# Patient Record
Sex: Female | Born: 1990 | Race: White | Hispanic: No | Marital: Married | Smoking: Current every day smoker
Health system: Southern US, Community
[De-identification: ages and names within clinical notes are randomized; demographics above are authoritative.]

## PROBLEM LIST (undated history)

## (undated) DIAGNOSIS — F32A Depression, unspecified: Secondary | ICD-10-CM

## (undated) DIAGNOSIS — I10 Essential (primary) hypertension: Secondary | ICD-10-CM

## (undated) DIAGNOSIS — K900 Celiac disease: Secondary | ICD-10-CM

## (undated) DIAGNOSIS — F329 Major depressive disorder, single episode, unspecified: Secondary | ICD-10-CM

## (undated) DIAGNOSIS — E039 Hypothyroidism, unspecified: Secondary | ICD-10-CM

## (undated) DIAGNOSIS — R569 Unspecified convulsions: Secondary | ICD-10-CM

## (undated) DIAGNOSIS — R87619 Unspecified abnormal cytological findings in specimens from cervix uteri: Secondary | ICD-10-CM

## (undated) HISTORY — DX: Major depressive disorder, single episode, unspecified: F32.9

## (undated) HISTORY — PX: REPAIR EXTENSOR TENDON HAND: SUR1171

## (undated) HISTORY — DX: Unspecified abnormal cytological findings in specimens from cervix uteri: R87.619

## (undated) HISTORY — DX: Depression, unspecified: F32.A

## (undated) HISTORY — DX: Essential (primary) hypertension: I10

## (undated) HISTORY — PX: TONSILLECTOMY: SUR1361

---
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
## 2010-07-26 ENCOUNTER — Emergency Department: Payer: Self-pay | Admitting: Emergency Medicine

## 2013-04-16 ENCOUNTER — Emergency Department: Payer: Self-pay | Admitting: Emergency Medicine

## 2013-06-25 ENCOUNTER — Emergency Department: Payer: Self-pay | Admitting: Emergency Medicine

## 2013-11-12 ENCOUNTER — Emergency Department: Payer: Self-pay | Admitting: Emergency Medicine

## 2013-11-12 LAB — URINALYSIS, COMPLETE
BILIRUBIN, UR: NEGATIVE
Bacteria: NONE SEEN
Blood: NEGATIVE
GLUCOSE, UR: NEGATIVE mg/dL (ref 0–75)
Ketone: NEGATIVE
LEUKOCYTE ESTERASE: NEGATIVE
Nitrite: NEGATIVE
Ph: 7 (ref 4.5–8.0)
Protein: NEGATIVE
SPECIFIC GRAVITY: 1.003 (ref 1.003–1.030)
Squamous Epithelial: <1
WBC UR: <1 /HPF (ref 0–5)

## 2013-11-12 LAB — COMPREHENSIVE METABOLIC PANEL
AST: 32 U/L (ref 15–37)
Albumin: 3.6 g/dL (ref 3.4–5.0)
Alkaline Phosphatase: 100 U/L
Anion Gap: 5 — ABNORMAL LOW (ref 7–16)
BUN: 6 mg/dL — ABNORMAL LOW (ref 7–18)
Bilirubin,Total: 0.2 mg/dL (ref 0.2–1.0)
CREATININE: 0.95 mg/dL (ref 0.60–1.30)
Calcium, Total: 8.4 mg/dL — ABNORMAL LOW (ref 8.5–10.1)
Chloride: 107 mmol/L (ref 98–107)
Co2: 26 mmol/L (ref 21–32)
EGFR (African American): >60
EGFR (Non-African Amer.): >60
Glucose: 82 mg/dL (ref 65–99)
OSMOLALITY: 272 (ref 275–301)
Potassium: 3.6 mmol/L (ref 3.5–5.1)
SGPT (ALT): 34 U/L (ref 12–78)
SODIUM: 138 mmol/L (ref 136–145)
Total Protein: 7.5 g/dL (ref 6.4–8.2)

## 2013-11-12 LAB — CBC WITH DIFFERENTIAL/PLATELET
Basophil #: 0.1 10*3/uL (ref 0.0–0.1)
Basophil %: 0.5 %
EOS PCT: 1.9 %
Eosinophil #: 0.2 10*3/uL (ref 0.0–0.7)
HCT: 38.8 % (ref 35.0–47.0)
HGB: 12.9 g/dL (ref 12.0–16.0)
LYMPHS ABS: 4.3 10*3/uL — AB (ref 1.0–3.6)
LYMPHS PCT: 34.1 %
MCH: 27.9 pg (ref 26.0–34.0)
MCHC: 33.2 g/dL (ref 32.0–36.0)
MCV: 84 fL (ref 80–100)
MONO ABS: 0.8 x10 3/mm (ref 0.2–0.9)
MONOS PCT: 6.6 %
Neutrophil #: 7.2 10*3/uL — ABNORMAL HIGH (ref 1.4–6.5)
Neutrophil %: 56.9 %
Platelet: 295 10*3/uL (ref 150–440)
RBC: 4.62 10*6/uL (ref 3.80–5.20)
RDW: 14.6 % — ABNORMAL HIGH (ref 11.5–14.5)
WBC: 12.7 10*3/uL — ABNORMAL HIGH (ref 3.6–11.0)

## 2013-12-22 ENCOUNTER — Emergency Department: Payer: Self-pay

## 2014-04-16 ENCOUNTER — Emergency Department: Payer: Self-pay | Admitting: Emergency Medicine

## 2014-09-08 ENCOUNTER — Emergency Department: Payer: Self-pay | Admitting: Student

## 2014-09-20 ENCOUNTER — Emergency Department: Payer: Self-pay | Admitting: Emergency Medicine

## 2014-09-20 ENCOUNTER — Ambulatory Visit: Payer: Self-pay | Admitting: Surgery

## 2014-12-15 ENCOUNTER — Emergency Department
Admission: EM | Admit: 2014-12-15 | Discharge: 2014-12-16 | Disposition: A | Discharge status: Home / Self Care | Payer: 59 | Attending: Student | Admitting: Student

## 2014-12-15 DIAGNOSIS — R1031 Right lower quadrant pain: Secondary | ICD-10-CM | POA: Insufficient documentation

## 2014-12-15 DIAGNOSIS — Z3202 Encounter for pregnancy test, result negative: Secondary | ICD-10-CM | POA: Insufficient documentation

## 2014-12-15 DIAGNOSIS — R112 Nausea with vomiting, unspecified: Secondary | ICD-10-CM | POA: Insufficient documentation

## 2014-12-15 DIAGNOSIS — R52 Pain, unspecified: Secondary | ICD-10-CM

## 2014-12-15 LAB — CBC WITH DIFFERENTIAL/PLATELET
Basophils Absolute: 0 10*3/uL (ref 0–0.1)
Basophils Relative: 1 %
EOS PCT: 3 %
Eosinophils Absolute: 0.2 10*3/uL (ref 0–0.7)
HEMATOCRIT: 41.3 % (ref 35.0–47.0)
Hemoglobin: 13.6 g/dL (ref 12.0–16.0)
Lymphocytes Relative: 38 %
Lymphs Abs: 3.5 10*3/uL (ref 1.0–3.6)
MCH: 28.6 pg (ref 26.0–34.0)
MCHC: 32.9 g/dL (ref 32.0–36.0)
MCV: 87 fL (ref 80.0–100.0)
MONOS PCT: 5 %
Monocytes Absolute: 0.5 10*3/uL (ref 0.2–0.9)
NEUTROS ABS: 4.9 10*3/uL (ref 1.4–6.5)
Neutrophils Relative %: 53 %
Platelets: 285 10*3/uL (ref 150–440)
RBC: 4.75 MIL/uL (ref 3.80–5.20)
RDW: 14.6 % — AB (ref 11.5–14.5)
WBC: 9.2 10*3/uL (ref 3.6–11.0)

## 2014-12-15 LAB — URINALYSIS COMPLETE WITH MICROSCOPIC (ARMC ONLY)
Bacteria, UA: NONE SEEN
Bilirubin Urine: NEGATIVE
Glucose, UA: NEGATIVE mg/dL
Hgb urine dipstick: NEGATIVE
Ketones, ur: NEGATIVE mg/dL
Leukocytes, UA: NEGATIVE
Nitrite: NEGATIVE
Protein, ur: NEGATIVE mg/dL
RBC / HPF: NONE SEEN RBC/hpf (ref 0–5)
Specific Gravity, Urine: 1.002 — ABNORMAL LOW (ref 1.005–1.030)
WBC, UA: NONE SEEN WBC/hpf (ref 0–5)
pH: 6 (ref 5.0–8.0)

## 2014-12-15 LAB — COMPREHENSIVE METABOLIC PANEL WITH GFR
ALT: 13 U/L — ABNORMAL LOW (ref 14–54)
AST: 25 U/L (ref 15–41)
Albumin: 4 g/dL (ref 3.5–5.0)
Alkaline Phosphatase: 67 U/L (ref 38–126)
Anion gap: 9 (ref 5–15)
BUN: 7 mg/dL (ref 6–20)
CO2: 22 mmol/L (ref 22–32)
Calcium: 9.2 mg/dL (ref 8.9–10.3)
Chloride: 108 mmol/L (ref 101–111)
Creatinine, Ser: 0.75 mg/dL (ref 0.44–1.00)
GFR calc Af Amer: >60 mL/min
GFR calc non Af Amer: >60 mL/min
Glucose, Bld: 130 mg/dL — ABNORMAL HIGH (ref 65–99)
Potassium: 3.4 mmol/L — ABNORMAL LOW (ref 3.5–5.1)
Sodium: 139 mmol/L (ref 135–145)
Total Bilirubin: 0.4 mg/dL (ref 0.3–1.2)
Total Protein: 7.5 g/dL (ref 6.5–8.1)

## 2014-12-15 LAB — POCT PREGNANCY, URINE: Preg Test, Ur: NEGATIVE

## 2014-12-15 LAB — LIPASE, BLOOD: LIPASE: 26 U/L (ref 22–51)

## 2014-12-15 NOTE — ED Provider Notes (Signed)
[facility] Emergency Department Provider Note  ____________________________________________  Time seen: Approximately 11:52 PM  I have reviewed the triage vital signs and the nursing notes.   HISTORY  Chief Complaint Abdominal Pain    HPI Renee Norman is a 24 y.o. female with no chronic medical problems, currently menstruating, who presents for evaluation of 3-4 days of intermittent gradual onset right lower quadrant pain radiating to the right flank. She also had one episode of nonbloody nonbilious emesis today. She reports the pain comes and goes in waves. Today this pain was quite severe and as she was walking to the kitchen, she had lightheadedness, tunnel vision and fainted falling onto the kitchen floor. She denies any head injury or any pain complaints as a result of the fall. The chest pain or difficulty breathing. No modifying factors. No diarrhea, no dysuria, no abnormal vaginal discharge.   No past medical history on file.  There are no active problems to display for this patient.   No past surgical history on file.  No current outpatient prescriptions on file.  Allergies Review of patient's allergies indicates no known allergies.  No family history on file.  Social History History  Substance Use Topics  . Smoking status: Not on file  . Smokeless tobacco: Not on file  . Alcohol Use: Not on file    Review of Systems Constitutional: No fever/chills Eyes: No visual changes. ENT: No sore throat. Cardiovascular: Denies chest pain. Respiratory: Denies shortness of breath. Gastrointestinal: + abdominal pain.  + nausea, + vomiting.  No diarrhea.  No constipation. Genitourinary: Negative for dysuria. Musculoskeletal: Negative for back pain. Skin: Negative for rash. Neurological: Negative for headaches, focal weakness or numbness.  10-point ROS otherwise negative.  ____________________________________________   PHYSICAL  EXAM:  VITAL SIGNS: ED Triage Vitals  Enc Vitals Group     BP 12/15/14 2121 146/84 mmHg     Pulse Rate 12/15/14 2121 91     Resp 12/15/14 2121 18     Temp 12/15/14 2121 98.4 F (36.9 C)     Temp Source 12/15/14 2121 Oral     SpO2 12/15/14 2121 98 %     Weight 12/15/14 2121 230 lb (104.327 kg)     Height 12/15/14 2121 5' 3"  (1.6 m)     Head Cir --      Peak Flow --      Pain Score 12/15/14 2122 7     Pain Loc --      Pain Edu? --      Excl. in [place]? --     Constitutional: Alert and oriented. Well appearing and in no acute distress. Eyes: Conjunctivae are normal. PERRL. EOMI. Head: Atraumatic. Nose: No congestion/rhinnorhea. Mouth/Throat: Mucous membranes are moist.  Oropharynx non-erythematous. Neck: No stridor.   Cardiovascular: Normal rate, regular rhythm. Grossly normal heart sounds.  Good peripheral circulation. Respiratory: Normal respiratory effort.  No retractions. Lungs CTAB. Gastrointestinal: Soft and nontender. No distention. No abdominal bruits. No CVA tenderness. Pelvic:small amount of dark blood in the vaginal vault from closed os, no bimanual tenderness or cervical motion tenderness  Musculoskeletal: No lower extremity tenderness nor edema.  No joint effusions. Neurologic:  Normal speech and language. No gross focal neurologic deficits are appreciated. Speech is normal. No gait instability. Skin:  Skin is warm, dry and intact. No rash noted. Psychiatric: Mood and affect are normal. Speech and behavior are normal.  ____________________________________________   LABS (all labs ordered are listed, but only abnormal results  are displayed)  Labs Reviewed  CBC WITH DIFFERENTIAL/PLATELET - Abnormal; Notable for the following:    RDW 14.6 (*)    All other components within normal limits  COMPREHENSIVE METABOLIC PANEL - Abnormal; Notable for the following:    Potassium 3.4 (*)    Glucose, Bld 130 (*)    ALT 13 (*)    All other components within normal limits   URINALYSIS COMPLETEWITH MICROSCOPIC ([facility] ONLY) - Abnormal; Notable for the following:    Color, Urine COLORLESS (*)    APPearance CLEAR (*)    Specific Gravity, Urine 1.002 (*)    Squamous Epithelial / LPF 0-5 (*)    All other components within normal limits  WET PREP, GENITAL  CHLAMYDIA/NGC RT PCR ([facility] ONLY)  LIPASE, BLOOD  TROPONIN I  POC URINE PREG, ED  POCT PREGNANCY, URINE   ____________________________________________  EKG  ED ECG REPORT I, Joanne Gavel, the attending physician, personally viewed and interpreted this ECG.   Date: 12/16/2014  EKG Time: 03:17  Rate: 57  Rhythm: sinus bradycardia  Axis: normal  Intervals:none  ST&T Change: No acute ST segment change  ____________________________________________  RADIOLOGY  CT abdomen and pelvis  FINDINGS: The appendix is normal. No acute inflammatory changes are evident in the abdomen or pelvis.  There are normal appearances of the liver, gallbladder, bile ducts, pancreas, spleen, adrenals and kidneys.  Stomach, small bowel and colon appear normal.  There is no abnormality in the lower chest.  There is a tiny fat containing umbilical hernia.  IMPRESSION: No significant abnormality.  Pelvic ultrasound FINDINGS: Uterus  Measurements: 8.1 x 3.6 x 5.8 cm. No fibroids or other mass visualized.  Endometrium  Thickness: 0.3 cm, within normal limits in caliber. No focal abnormality visualized.  Right ovary  Measurements: 4.4 x 2.6 x 2.6 cm. Normal appearance/no adnexal mass.  Left ovary  Measurements: 3.1 x 2.4 x 2.5 cm. Normal appearance/no adnexal mass.  Pulsed Doppler evaluation of both ovaries demonstrates normal low-resistance arterial and venous waveforms.  Other findings  No free fluid is seen in the pelvic cul-de-sac.  IMPRESSION: Unremarkable pelvic ultrasound. No evidence for ovarian  torsion.   ____________________________________________   PROCEDURES  Procedure(s) performed: None  Critical Care performed: No  ____________________________________________   INITIAL IMPRESSION / ASSESSMENT AND PLAN / ED COURSE  Pertinent labs & imaging results that were available during my care of the patient were reviewed by me and considered in my medical decision making (see chart for details).  Renee Norman is a 24 y.o. female with no chronic medical problems, currently menstruating, who presents for evaluation of 3-4 days of intermittent gradual onset right lower quadrant pain radiating to the right flank. On exam, she is generally well-appearing and in no acute distress. Vital signs stable, she is afebrile. Differential diagnosis includes acute ovarian pathology such as torsion, less likely tubo-ovarian abscess, appendicitis, kidney stone. I suspect she had a vasovagal syncope event secondary to pain. Intact neurological exam, no chest pain or difficulty breathing. Doubt purely cardiogenic or neurogenic syncope in this young healthy patient. We'll treat her pain, obtain ultrasound, basic labs, urinalysis, urine pregnancy.   ----------------------------------------- 6:19 AM on 12/16/2014 -----------------------------------------  EKG reassuring. CT and ultrasound negative for any acute intra-abdominal pathology. Wet prep positive for Trichomonas and the patient was treated empirically for all sexually transmitted infections with IM ceftriaxone, by mouth azithromycin, by mouth Flagyl. Pain is well controlled. We'll discharge with doxycycline for treatment of possible pelvic inflammatory disease. Discussed  return precautions and close GYN follow-up. She is comfortable with the discharge plan.   ____________________________________________   FINAL CLINICAL IMPRESSION(S) / ED DIAGNOSES  Final diagnoses:  Pain  RLQ abdominal pain      Joanne Gavel, MD 12/16/14  [phone]

## 2014-12-15 NOTE — ED Notes (Signed)
Patient reports right lower abd pain with nausea and vomiting since yesterday.  Patient reports fever at home and states she had an episode where she passed out.

## 2014-12-16 ENCOUNTER — Emergency Department: Payer: 59

## 2014-12-16 LAB — TROPONIN I: Troponin I: <0.03 ng/mL (ref <0.031)

## 2014-12-16 LAB — WET PREP, GENITAL
Clue Cells Wet Prep HPF POC: NONE SEEN
YEAST WET PREP: NONE SEEN

## 2014-12-16 LAB — CHLAMYDIA/NGC RT PCR (ARMC ONLY)
Chlamydia Tr: NOT DETECTED
N GONORRHOEAE: NOT DETECTED

## 2014-12-16 MED ORDER — ONDANSETRON HCL 4 MG/2ML IJ SOLN
4.0000 mg | Freq: Once | INTRAMUSCULAR | Status: AC
  Administered 2014-12-16: 4 mg via INTRAVENOUS

## 2014-12-16 MED ORDER — SODIUM CHLORIDE 0.9 % IV BOLUS (SEPSIS)
1000.0000 mL | Freq: Once | INTRAVENOUS | Status: AC
  Administered 2014-12-16: 1000 mL via INTRAVENOUS

## 2014-12-16 MED ORDER — CEFTRIAXONE SODIUM 250 MG IJ SOLR
250.0000 mg | Freq: Once | INTRAMUSCULAR | Status: AC
  Administered 2014-12-16: 250 mg via INTRAMUSCULAR

## 2014-12-16 MED ORDER — DOXYCYCLINE HYCLATE 50 MG PO CAPS: 100.0000 mg | ORAL_CAPSULE | Freq: Two times a day (BID) | ORAL | Status: DC

## 2014-12-16 MED ORDER — MORPHINE SULFATE 4 MG/ML IJ SOLN: 4.0000 mg | Freq: Once | INTRAMUSCULAR | Status: DC

## 2014-12-16 MED ORDER — AZITHROMYCIN 1 G PO PACK: 1.0000 g | PACK | Freq: Once | ORAL | Status: DC

## 2014-12-16 MED ORDER — ONDANSETRON HCL 4 MG/2ML IJ SOLN
INTRAMUSCULAR | Status: AC
  Administered 2014-12-16: 4 mg via INTRAVENOUS
  Filled 2014-12-16: qty 2

## 2014-12-16 MED ORDER — IOHEXOL 300 MG/ML  SOLN
100.0000 mL | Freq: Once | INTRAMUSCULAR | Status: AC | PRN
  Administered 2014-12-16: 100 mL via INTRAVENOUS

## 2014-12-16 MED ORDER — IBUPROFEN 600 MG PO TABS: 600.0000 mg | ORAL_TABLET | Freq: Four times a day (QID) | ORAL | Status: DC | PRN

## 2014-12-16 MED ORDER — AZITHROMYCIN 250 MG PO TABS
1000.0000 mg | ORAL_TABLET | Freq: Once | ORAL | Status: AC
  Administered 2014-12-16: 1000 mg via ORAL

## 2014-12-16 MED ORDER — METRONIDAZOLE 500 MG PO TABS
2000.0000 mg | ORAL_TABLET | Freq: Once | ORAL | Status: AC
  Administered 2014-12-16: 2000 mg via ORAL
  Filled 2014-12-16: qty 4

## 2014-12-16 MED ORDER — AZITHROMYCIN 250 MG PO TABS
ORAL_TABLET | ORAL | Status: AC
  Filled 2014-12-16: qty 4

## 2014-12-16 MED ORDER — IOHEXOL 240 MG/ML SOLN
25.0000 mL | Freq: Once | INTRAMUSCULAR | Status: AC | PRN
  Administered 2014-12-16: 25 mL via ORAL

## 2014-12-16 MED ORDER — MORPHINE SULFATE 10 MG/ML IJ SOLN
INTRAMUSCULAR | Status: AC
  Administered 2014-12-16: 4 mg
  Filled 2014-12-16: qty 1

## 2014-12-16 MED ORDER — CEFTRIAXONE SODIUM 250 MG IJ SOLR
INTRAMUSCULAR | Status: AC
  Filled 2014-12-16: qty 250

## 2014-12-16 NOTE — ED Notes (Signed)
Pt back to room.

## 2014-12-16 NOTE — ED Notes (Signed)
Patient transported to CT 

## 2014-12-16 NOTE — ED Notes (Signed)
Pt hyperventilating and screaming after rocephin injection. Ice applied to injection site. Pt calms with reassurance. Family remains at bedside.

## 2014-12-16 NOTE — ED Notes (Signed)
Pt and family sleeping in room.

## 2014-12-16 NOTE — ED Notes (Signed)
Patient transported to Ultrasound 

## 2015-09-25 ENCOUNTER — Emergency Department
Admission: EM | Admit: 2015-09-25 | Discharge: 2015-09-25 | Disposition: A | Discharge status: Home / Self Care | Payer: 59 | Attending: Student | Admitting: Student

## 2015-09-25 DIAGNOSIS — F17210 Nicotine dependence, cigarettes, uncomplicated: Secondary | ICD-10-CM | POA: Diagnosis not present

## 2015-09-25 DIAGNOSIS — J0110 Acute frontal sinusitis, unspecified: Secondary | ICD-10-CM | POA: Diagnosis not present

## 2015-09-25 DIAGNOSIS — J209 Acute bronchitis, unspecified: Secondary | ICD-10-CM | POA: Insufficient documentation

## 2015-09-25 DIAGNOSIS — R05 Cough: Secondary | ICD-10-CM | POA: Diagnosis present

## 2015-09-25 MED ORDER — ALBUTEROL SULFATE HFA 108 (90 BASE) MCG/ACT IN AERS: 2.0000 | INHALATION_SPRAY | RESPIRATORY_TRACT | Status: DC | PRN

## 2015-09-25 MED ORDER — AMOXICILLIN 500 MG PO TABS: 500.0000 mg | ORAL_TABLET | Freq: Three times a day (TID) | ORAL | Status: DC

## 2015-09-25 MED ORDER — GUAIFENESIN-CODEINE 100-10 MG/5ML PO SYRP: 5.0000 mL | ORAL_SOLUTION | Freq: Three times a day (TID) | ORAL | Status: DC | PRN

## 2015-09-25 MED ORDER — PREDNISONE 10 MG PO TABS: 50.0000 mg | ORAL_TABLET | Freq: Every day | ORAL | Status: DC

## 2015-09-25 NOTE — ED Provider Notes (Signed)
[facility] Emergency Department Provider Note  ____________________________________________  Time seen: Approximately 3:13 PM  I have reviewed the triage vital signs and the nursing notes.   HISTORY  Chief Complaint Influenza   HPI Renee Norman is a 25 y.o. female who presents to the emergency department for evaluation of cough, nasal congestion, sore throat, and wheezing x 1 week. Fever started yesterday. Only minor improvement with cough, cold, and flu medications.    History reviewed. No pertinent past medical history.  There are no active problems to display for this patient.   Past Surgical History  Procedure Laterality Date  . Tonsillectomy      Current Outpatient Rx  Name  Route  Sig  Dispense  Refill  . albuterol (PROVENTIL HFA;VENTOLIN HFA) 108 (90 Base) MCG/ACT inhaler   Inhalation   Inhale 2 puffs into the lungs every 4 (four) hours as needed for wheezing or shortness of breath.   1 Inhaler   1   . amoxicillin (AMOXIL) 500 MG tablet   Oral   Take 1 tablet (500 mg total) by mouth 3 (three) times daily.   30 tablet   0   . doxycycline (VIBRAMYCIN) 50 MG capsule   Oral   Take 2 capsules (100 mg total) by mouth 2 (two) times daily.   56 capsule   0   . guaiFENesin-codeine (ROBITUSSIN AC) 100-10 MG/5ML syrup   Oral   Take 5 mLs by mouth 3 (three) times daily as needed for cough.   120 mL   0   . ibuprofen (ADVIL,MOTRIN) 600 MG tablet   Oral   Take 1 tablet (600 mg total) by mouth every 6 (six) hours as needed for moderate pain.   15 tablet   0   . predniSONE (DELTASONE) 10 MG tablet   Oral   Take 5 tablets (50 mg total) by mouth daily.   25 tablet   0     Allergies Review of patient's allergies indicates no known allergies.  No family history on file.  Social History Social History  Substance Use Topics  . Smoking status: Current Every Day Smoker -- 0.50 packs/day    Types: Cigarettes  . Smokeless  tobacco: None  . Alcohol Use: Yes     Comment: occassionally    Review of Systems Constitutional: Positive for fever/chills ENT: Positive for  sore throat and sinus congestion. Respiratory: No shortness of breath. Positive for cough. Gastrointestinal: No abdominal pain. Occasional nausea,  no vomiting.  No diarrhea.  Genitourinary: Negative for dysuria. Musculoskeletal: Negative for body aches Skin: Negative for rash. Neurological: Positive for headaches, Negative for focal weakness or numbness.  ____________________________________________   PHYSICAL EXAM:  VITAL SIGNS: ED Triage Vitals  Enc Vitals Group     BP 09/25/15 1423 156/84 mmHg     Pulse Rate 09/25/15 1423 72     Resp 09/25/15 1423 17     Temp 09/25/15 1423 97.9 F (36.6 C)     Temp Source 09/25/15 1423 Oral     SpO2 09/25/15 1423 99 %     Weight 09/25/15 1423 220 lb (99.791 kg)     Height 09/25/15 1423 5' 4"  (1.626 m)     Head Cir --      Peak Flow --      Pain Score 09/25/15 1424 6     Pain Loc --      Pain Edu? --      Excl. in [place]? --  Constitutional: Alert and oriented. Well appearing and in no acute distress. Eyes: Conjunctivae are normal. PERRL. EOMI. Ears: TM normal bilaterally Nose: Frontal sinus tenderness on percussion Mouth/Throat: Mucous membranes are moist.  Oropharynx erythematous without tonsillar exudate or edema. Neck: No stridor.  Lymphatic: Bilateral aterior cervical lymphadenopathy. Cardiovascular: Normal rate, regular rhythm. Grossly normal heart sounds.  Good peripheral circulation. Respiratory: Normal respiratory effort.  No retractions. Breath sounds clear bilaterally. Gastrointestinal: Soft and nontender. No distention. No abdominal bruits. N Musculoskeletal: Active ROM x 4 extremities observed. Neurologic:  Normal speech and language. No gross focal neurologic deficits are appreciated. Speech is normal. No gait instability. Skin:  Skin is warm, dry and intact. No rash  noted. Psychiatric: Mood and affect are normal. Speech and behavior are normal.  ____________________________________________   LABS (all labs ordered are listed, but only abnormal results are displayed)  Labs Reviewed - No data to display ____________________________________________  EKG   ____________________________________________  RADIOLOGY  Not indicated ____________________________________________   PROCEDURES  Procedure(s) performed: None  Critical Care performed: No  ____________________________________________   INITIAL IMPRESSION / ASSESSMENT AND PLAN / ED COURSE  Pertinent labs & imaging results that were available during my care of the patient were reviewed by me and considered in my medical decision making (see chart for details).  Patient to take amoxicillin and prednisone as prescribed. She will use Albuterol prn and Robitussin AC prn as directed. She is to follow up with her PCP for symptoms that are not improving over the next few days or return to the emergency department for symptoms that change or worsen if unable to schedule an appointment. ____________________________________________   FINAL CLINICAL IMPRESSION(S) / ED DIAGNOSES  Final diagnoses:  Acute frontal sinusitis, recurrence not specified  Acute bronchitis, unspecified organism       Victorino Dike, Luis M. Cintron 09/25/15 1600  Joanne Gavel, MD 09/26/15 [phone]

## 2015-09-25 NOTE — ED Notes (Signed)
States she developed some congestion/cough about 1 week ago.. Then fever since yesterday

## 2015-09-25 NOTE — ED Notes (Signed)
Pt c/o cough with sinus and chest congestion, bodyaches and fever for the past couple of days.. States she has been taking theraflu, tylenol and cold/cough meds without relief.

## 2015-09-25 NOTE — Discharge Instructions (Signed)
Sinusitis, Adult Sinusitis is redness, soreness, and inflammation of the paranasal sinuses. Paranasal sinuses are air pockets within the bones of your face. They are located beneath your eyes, in the middle of your forehead, and above your eyes. In healthy paranasal sinuses, mucus is able to drain out, and air is able to circulate through them by way of your nose. However, when your paranasal sinuses are inflamed, mucus and air can become trapped. This can allow bacteria and other germs to grow and cause infection. Sinusitis can develop quickly and last only a short time (acute) or continue over a long period (chronic). Sinusitis that lasts for more than 12 weeks is considered chronic. CAUSES Causes of sinusitis include:  Allergies.  Structural abnormalities, such as displacement of the cartilage that separates your nostrils (deviated septum), which can decrease the air flow through your nose and sinuses and affect sinus drainage.  Functional abnormalities, such as when the small hairs (cilia) that line your sinuses and help remove mucus do not work properly or are not present. SIGNS AND SYMPTOMS Symptoms of acute and chronic sinusitis are the same. The primary symptoms are pain and pressure around the affected sinuses. Other symptoms include:  Upper toothache.  Earache.  Headache.  Bad breath.  Decreased sense of smell and taste.  A cough, which worsens when you are lying flat.  Fatigue.  Fever.  Thick drainage from your nose, which often is green and may contain pus (purulent).  Swelling and warmth over the affected sinuses. DIAGNOSIS Your health care provider will perform a physical exam. During your exam, your health care provider may perform any of the following to help determine if you have acute sinusitis or chronic sinusitis:  Look in your nose for signs of abnormal growths in your nostrils (nasal polyps).  Tap over the affected sinus to check for signs of  infection.  View the inside of your sinuses using an imaging device that has a light attached (endoscope). If your health care provider suspects that you have chronic sinusitis, one or more of the following tests may be recommended:  Allergy tests.  Nasal culture. A sample of mucus is taken from your nose, sent to a lab, and screened for bacteria.  Nasal cytology. A sample of mucus is taken from your nose and examined by your health care provider to determine if your sinusitis is related to an allergy. TREATMENT Most cases of acute sinusitis are related to a viral infection and will resolve on their own within 10 days. Sometimes, medicines are prescribed to help relieve symptoms of both acute and chronic sinusitis. These may include pain medicines, decongestants, nasal steroid sprays, or saline sprays. However, for sinusitis related to a bacterial infection, your health care provider will prescribe antibiotic medicines. These are medicines that will help kill the bacteria causing the infection. Rarely, sinusitis is caused by a fungal infection. In these cases, your health care provider will prescribe antifungal medicine. For some cases of chronic sinusitis, surgery is needed. Generally, these are cases in which sinusitis recurs more than 3 times per year, despite other treatments. HOME CARE INSTRUCTIONS  Drink plenty of water. Water helps thin the mucus so your sinuses can drain more easily.  Use a humidifier.  Inhale steam 3-4 times a day (for example, sit in the bathroom with the shower running).  Apply a warm, moist washcloth to your face 3-4 times a day, or as directed by your health care provider.  Use saline nasal sprays to help  moisten and clean your sinuses.  Take medicines only as directed by your health care provider.  If you were prescribed either an antibiotic or antifungal medicine, finish it all even if you start to feel better. SEEK IMMEDIATE MEDICAL CARE IF:  You have  increasing pain or severe headaches.  You have nausea, vomiting, or drowsiness.  You have swelling around your face.  You have vision problems.  You have a stiff neck.  You have difficulty breathing.   This information is not intended to replace advice given to you by your health care provider. Make sure you discuss any questions you have with your health care provider.   Document Released: 06/23/2005 Document Revised: 07/14/2014 Document Reviewed: 07/08/2011 Elsevier Interactive Patient Education 2016 [employer]  Acute Bronchitis Bronchitis is inflammation of the airways that extend from the windpipe into the lungs (bronchi). The inflammation often causes mucus to develop. This leads to a cough, which is the most common symptom of bronchitis.  In acute bronchitis, the condition usually develops suddenly and goes away over time, usually in a couple weeks. Smoking, allergies, and asthma can make bronchitis worse. Repeated episodes of bronchitis may cause further lung problems.  CAUSES Acute bronchitis is most often caused by the same virus that causes a cold. The virus can spread from person to person (contagious) through coughing, sneezing, and touching contaminated objects. SIGNS AND SYMPTOMS   Cough.   Fever.   Coughing up mucus.   Body aches.   Chest congestion.   Chills.   Shortness of breath.   Sore throat.  DIAGNOSIS  Acute bronchitis is usually diagnosed through a physical exam. Your health care provider will also ask you questions about your medical history. Tests, such as chest X-rays, are sometimes done to rule out other conditions.  TREATMENT  Acute bronchitis usually goes away in a couple weeks. Oftentimes, no medical treatment is necessary. Medicines are sometimes given for relief of fever or cough. Antibiotic medicines are usually not needed but may be prescribed in certain situations. In some cases, an inhaler may be recommended to help reduce  shortness of breath and control the cough. A cool mist vaporizer may also be used to help thin bronchial secretions and make it easier to clear the chest.  HOME CARE INSTRUCTIONS  Get plenty of rest.   Drink enough fluids to keep your urine clear or pale yellow (unless you have a medical condition that requires fluid restriction). Increasing fluids may help thin your respiratory secretions (sputum) and reduce chest congestion, and it will prevent dehydration.   Take medicines only as directed by your health care provider.  If you were prescribed an antibiotic medicine, finish it all even if you start to feel better.  Avoid smoking and secondhand smoke. Exposure to cigarette smoke or irritating chemicals will make bronchitis worse. If you are a smoker, consider using nicotine gum or skin patches to help control withdrawal symptoms. Quitting smoking will help your lungs heal faster.   Reduce the chances of another bout of acute bronchitis by washing your hands frequently, avoiding people with cold symptoms, and trying not to touch your hands to your mouth, nose, or eyes.   Keep all follow-up visits as directed by your health care provider.  SEEK MEDICAL CARE IF: Your symptoms do not improve after 1 week of treatment.  SEEK IMMEDIATE MEDICAL CARE IF:  You develop an increased fever or chills.   You have chest pain.   You have severe shortness of  breath.  You have bloody sputum.   You develop dehydration.  You faint or repeatedly feel like you are going to pass out.  You develop repeated vomiting.  You develop a severe headache. MAKE SURE YOU:   Understand these instructions.  Will watch your condition.  Will get help right away if you are not doing well or get worse.   This information is not intended to replace advice given to you by your health care provider. Make sure you discuss any questions you have with your health care provider.   Document Released:  07/31/2004 Document Revised: 07/14/2014 Document Reviewed: 12/14/2012 Elsevier Interactive Patient Education [employer].

## 2015-10-04 ENCOUNTER — Emergency Department
Admission: EM | Admit: 2015-10-04 | Discharge: 2015-10-04 | Disposition: A | Discharge status: Home / Self Care | Payer: 59 | Attending: Emergency Medicine | Admitting: Emergency Medicine

## 2015-10-04 ENCOUNTER — Encounter: Payer: Self-pay | Admitting: Emergency Medicine

## 2015-10-04 DIAGNOSIS — J309 Allergic rhinitis, unspecified: Secondary | ICD-10-CM | POA: Insufficient documentation

## 2015-10-04 DIAGNOSIS — R05 Cough: Secondary | ICD-10-CM | POA: Diagnosis present

## 2015-10-04 DIAGNOSIS — Z79899 Other long term (current) drug therapy: Secondary | ICD-10-CM | POA: Diagnosis not present

## 2015-10-04 DIAGNOSIS — Z7952 Long term (current) use of systemic steroids: Secondary | ICD-10-CM | POA: Insufficient documentation

## 2015-10-04 DIAGNOSIS — F17210 Nicotine dependence, cigarettes, uncomplicated: Secondary | ICD-10-CM | POA: Diagnosis not present

## 2015-10-04 DIAGNOSIS — Z792 Long term (current) use of antibiotics: Secondary | ICD-10-CM | POA: Insufficient documentation

## 2015-10-04 MED ORDER — FEXOFENADINE-PSEUDOEPHED ER 60-120 MG PO TB12: 1.0000 | ORAL_TABLET | Freq: Two times a day (BID) | ORAL | Status: DC

## 2015-10-04 MED ORDER — BENZONATATE 100 MG PO CAPS: 100.0000 mg | ORAL_CAPSULE | Freq: Three times a day (TID) | ORAL | Status: DC | PRN

## 2015-10-04 NOTE — ED Notes (Signed)
See triage.  States she was seen couple of weeks ago. And given prednisone and amoxil.  States she finished her meds and feels worse

## 2015-10-04 NOTE — ED Provider Notes (Signed)
[facility] Emergency Department Provider Note  ____________________________________________  Time seen: Approximately 1:31 PM  I have reviewed the triage vital signs and the nursing notes.   HISTORY  Chief Complaint sinus congestion  and Cough    HPI Renee Norman is a 25 y.o. female patient complaining of continued sinus congestion for couple weeks. Patient was seen on 09/26/2015 at this ED diagnosis sinusitis given amoxicillin and prednisone. Patient stated this been no change in her condition.She rates the pain as 8/10. Patient state postnasal drainage causing her cough. No palliative measures taken at this time. Patient did not follow-up with family doctor as directed from her last ER visit.  History reviewed. No pertinent past medical history.  There are no active problems to display for this patient.   Past Surgical History  Procedure Laterality Date  . Tonsillectomy      Current Outpatient Rx  Name  Route  Sig  Dispense  Refill  . albuterol (PROVENTIL HFA;VENTOLIN HFA) 108 (90 Base) MCG/ACT inhaler   Inhalation   Inhale 2 puffs into the lungs every 4 (four) hours as needed for wheezing or shortness of breath.   1 Inhaler   1   . amoxicillin (AMOXIL) 500 MG tablet   Oral   Take 1 tablet (500 mg total) by mouth 3 (three) times daily.   30 tablet   0   . benzonatate (TESSALON PERLES) 100 MG capsule   Oral   Take 1 capsule (100 mg total) by mouth 3 (three) times daily as needed for cough.   15 capsule   0   . doxycycline (VIBRAMYCIN) 50 MG capsule   Oral   Take 2 capsules (100 mg total) by mouth 2 (two) times daily.   56 capsule   0   . fexofenadine-pseudoephedrine (ALLEGRA-D) 60-120 MG 12 hr tablet   Oral   Take 1 tablet by mouth 2 (two) times daily.   20 tablet   0   . guaiFENesin-codeine (ROBITUSSIN AC) 100-10 MG/5ML syrup   Oral   Take 5 mLs by mouth 3 (three) times daily as needed for cough.   120 mL   0   .  ibuprofen (ADVIL,MOTRIN) 600 MG tablet   Oral   Take 1 tablet (600 mg total) by mouth every 6 (six) hours as needed for moderate pain.   15 tablet   0   . predniSONE (DELTASONE) 10 MG tablet   Oral   Take 5 tablets (50 mg total) by mouth daily.   25 tablet   0     Allergies Review of patient's allergies indicates no known allergies.  No family history on file.  Social History Social History  Substance Use Topics  . Smoking status: Current Every Day Smoker -- 0.50 packs/day    Types: Cigarettes  . Smokeless tobacco: None  . Alcohol Use: Yes     Comment: occassionally    Review of Systems Constitutional: No fever/chills Eyes: No visual changes. ENT: No sore throat. Bilateral nasal congestion and facial pain. Cardiovascular: Denies chest pain. Respiratory: Denies shortness of breath. Gastrointestinal: No abdominal pain.  No nausea, no vomiting.  No diarrhea.  No constipation. Genitourinary: Negative for dysuria. Musculoskeletal: Negative for back pain. Skin: Negative for rash. Neurological: Negative for headaches, focal weakness or numbness.   ____________________________________________   PHYSICAL EXAM:  VITAL SIGNS: ED Triage Vitals  Enc Vitals Group     BP 10/04/15 1254 163/98 mmHg     Pulse Rate 10/04/15  1254 70     Resp 10/04/15 1254 18     Temp 10/04/15 1254 98.1 F (36.7 C)     Temp Source 10/04/15 1254 Oral     SpO2 10/04/15 1254 99 %     Weight 10/04/15 1254 225 lb (102.059 kg)     Height 10/04/15 1254 5' 4"  (1.626 m)     Head Cir --      Peak Flow --      Pain Score 10/04/15 1254 8     Pain Loc --      Pain Edu? --      Excl. in [place]? --     Constitutional: Alert and oriented. Well appearing and in no acute distress. Eyes: Conjunctivae are normal. PERRL. EOMI. Head: Atraumatic. Nose: Bilateral maxillary guarding, edematous nasal turbinates, clear rhinorrhea. Mouth/Throat: Mucous membranes are moist.  Oropharynx non-erythematous. His  postnasal drainage. Neck: No stridor.  No cervical spine tenderness to palpation. Hematological/Lymphatic/Immunilogical: No cervical lymphadenopathy. Cardiovascular: Normal rate, regular rhythm. Grossly normal heart sounds.  Good peripheral circulation. We'll get a blood pressure Respiratory: Normal respiratory effort.  No retractions. Lungs CTAB. Gastrointestinal: Soft and nontender. No distention. No abdominal bruits. No CVA tenderness. Musculoskeletal: No lower extremity tenderness nor edema.  No joint effusions. Neurologic:  Normal speech and language. No gross focal neurologic deficits are appreciated. No gait instability. Skin:  Skin is warm, dry and intact. No rash noted. Psychiatric: Mood and affect are normal. Speech and behavior are normal.  ____________________________________________   LABS (all labs ordered are listed, but only abnormal results are displayed)  Labs Reviewed - No data to display ____________________________________________  EKG   ____________________________________________  RADIOLOGY   ____________________________________________   PROCEDURES  Procedure(s) performed: None  Critical Care performed: No  ____________________________________________   INITIAL IMPRESSION / ASSESSMENT AND PLAN / ED COURSE  Pertinent labs & imaging results that were available during my care of the patient were reviewed by me and considered in my medical decision making (see chart for details).  Allergic rhinitis.  Discharge care instructions. Patient given a prescription for Allegra-D. Patient advised to follow-up family doctor consider consult to an allergist for definitive diagnosis and treatment. ____________________________________________   FINAL CLINICAL IMPRESSION(S) / ED DIAGNOSES  Final diagnoses:  Allergic rhinitis, unspecified allergic rhinitis type      Sable Feil, PA-C 10/04/15 Ramos, PA-C 10/04/15 1344  Lavonia Drafts, MD 10/04/15 [phone]

## 2015-10-04 NOTE — Discharge Instructions (Signed)
Allergic Rhinitis Allergic rhinitis is when the mucous membranes in the nose respond to allergens. Allergens are particles in the air that cause your body to have an allergic reaction. This causes you to release allergic antibodies. Through a chain of events, these eventually cause you to release histamine into the blood stream. Although meant to protect the body, it is this release of histamine that causes your discomfort, such as frequent sneezing, congestion, and an itchy, runny nose.  CAUSES Seasonal allergic rhinitis (hay fever) is caused by pollen allergens that may come from grasses, trees, and weeds. Year-round allergic rhinitis (perennial allergic rhinitis) is caused by allergens such as house dust mites, pet dander, and mold spores. SYMPTOMS  Nasal stuffiness (congestion).  Itchy, runny nose with sneezing and tearing of the eyes. DIAGNOSIS Your health care provider can help you determine the allergen or allergens that trigger your symptoms. If you and your health care provider are unable to determine the allergen, skin or blood testing may be used. Your health care provider will diagnose your condition after taking your health history and performing a physical exam. Your health care provider may assess you for other related conditions, such as asthma, pink eye, or an ear infection. TREATMENT Allergic rhinitis does not have a cure, but it can be controlled by:  Medicines that block allergy symptoms. These may include allergy shots, nasal sprays, and oral antihistamines.  Avoiding the allergen. Hay fever may often be treated with antihistamines in pill or nasal spray forms. Antihistamines block the effects of histamine. There are over-the-counter medicines that may help with nasal congestion and swelling around the eyes. Check with your health care provider before taking or giving this medicine. If avoiding the allergen or the medicine prescribed do not work, there are many new medicines  your health care provider can prescribe. Stronger medicine may be used if initial measures are ineffective. Desensitizing injections can be used if medicine and avoidance does not work. Desensitization is when a patient is given ongoing shots until the body becomes less sensitive to the allergen. Make sure you follow up with your health care provider if problems continue. HOME CARE INSTRUCTIONS It is not possible to completely avoid allergens, but you can reduce your symptoms by taking steps to limit your exposure to them. It helps to know exactly what you are allergic to so that you can avoid your specific triggers. SEEK MEDICAL CARE IF:  You have a fever.  You develop a cough that does not stop easily (persistent).  You have shortness of breath.  You start wheezing.  Symptoms interfere with normal daily activities.   This information is not intended to replace advice given to you by your health care provider. Make sure you discuss any questions you have with your health care provider.   Document Released: 03/18/2001 Document Revised: 07/14/2014 Document Reviewed: 02/28/2013 Elsevier Interactive Patient Education 2016 [employer]  

## 2015-10-04 NOTE — ED Notes (Signed)
C/O sinus congestion, ear pain, upper back sore, headaches x 3 weeks.  Seen in ED 10 days ago for same, treated with prednisone and amoxicillin - symptoms have not improved.

## 2017-01-13 ENCOUNTER — Emergency Department
Admission: EM | Admit: 2017-01-13 | Discharge: 2017-01-13 | Disposition: A | Discharge status: Home / Self Care | Payer: Self-pay | Attending: Emergency Medicine | Admitting: Emergency Medicine

## 2017-01-13 ENCOUNTER — Emergency Department: Payer: Self-pay

## 2017-01-13 ENCOUNTER — Encounter: Payer: Self-pay | Admitting: *Deleted

## 2017-01-13 DIAGNOSIS — F17210 Nicotine dependence, cigarettes, uncomplicated: Secondary | ICD-10-CM | POA: Insufficient documentation

## 2017-01-13 DIAGNOSIS — B349 Viral infection, unspecified: Secondary | ICD-10-CM | POA: Insufficient documentation

## 2017-01-13 DIAGNOSIS — J209 Acute bronchitis, unspecified: Secondary | ICD-10-CM | POA: Insufficient documentation

## 2017-01-13 MED ORDER — AZITHROMYCIN 500 MG PO TABS
500.0000 mg | ORAL_TABLET | Freq: Once | ORAL | Status: AC
  Administered 2017-01-13: 500 mg via ORAL
  Filled 2017-01-13: qty 1

## 2017-01-13 MED ORDER — AZITHROMYCIN 250 MG PO TABS: ORAL_TABLET | ORAL | 0 refills | Status: AC

## 2017-01-13 MED ORDER — ALBUTEROL SULFATE HFA 108 (90 BASE) MCG/ACT IN AERS: 2.0000 | INHALATION_SPRAY | Freq: Four times a day (QID) | RESPIRATORY_TRACT | 0 refills | Status: DC | PRN

## 2017-01-13 NOTE — ED Provider Notes (Signed)
[facility] Emergency Department Provider Note  ____________________________________________   First MD Initiated Contact with Patient 01/13/17 [phone]     (approximate)  I have reviewed the triage vital signs and the nursing notes.   HISTORY  Chief Complaint Cough   HPI Renee Norman is a 26 y.o. female who is presenting to the emergency department today with a cough, fever and nasal congestion over the past 4 days. She also feels chest pain which is sharp as well as back pain when she coughs. She is also had posttussive emesis and also diarrhea when vomiting. She says that she has been able to tolerate food. To this sugar ibuprofen prior to coming to the hospital. Says that she is also been exposed to her neighbors who have been diagnosed recently with pneumonia. She is a pack per day smoker.   History reviewed. No pertinent past medical history.  There are no active problems to display for this patient.   Past Surgical History:  Procedure Laterality Date  . TONSILLECTOMY      Prior to Admission medications   Medication Sig Start Date End Date Taking? Authorizing Provider  albuterol (PROVENTIL HFA;VENTOLIN HFA) 108 (90 Base) MCG/ACT inhaler Inhale 2 puffs into the lungs every 4 (four) hours as needed for wheezing or shortness of breath. 09/25/15  Yes Triplett, Cari B, FNP  benzonatate (TESSALON PERLES) 100 MG capsule Take 1 capsule (100 mg total) by mouth 3 (three) times daily as needed for cough. Patient not taking: Reported on 01/13/2017 10/04/15   Sable Feil, PA-C  fexofenadine-pseudoephedrine (ALLEGRA-D) 60-120 MG 12 hr tablet Take 1 tablet by mouth 2 (two) times daily. Patient not taking: Reported on 01/13/2017 10/04/15   Sable Feil, PA-C  guaiFENesin-codeine [facility]) 100-10 MG/5ML syrup Take 5 mLs by mouth 3 (three) times daily as needed for cough. Patient not taking: Reported on 01/13/2017 09/25/15   Sherrie George B, FNP  ibuprofen  (ADVIL,MOTRIN) 600 MG tablet Take 1 tablet (600 mg total) by mouth every 6 (six) hours as needed for moderate pain. Patient not taking: Reported on 01/13/2017 12/16/14   Joanne Gavel, MD    Allergies Patient has no known allergies.  No family history on file.  Social History Social History  Substance Use Topics  . Smoking status: Current Every Day Smoker    Packs/day: 0.50    Types: Cigarettes  . Smokeless tobacco: Not on file  . Alcohol use Yes     Comment: occassionally    Review of Systems  Constitutional: No fever/chills Eyes: No visual changes. ENT: No sore throat. Cardiovascular:as above Respiratory:as above Gastrointestinal: No abdominal pain.  No constipation. Genitourinary: Negative for dysuria. Musculoskeletal: Negative for back pain. Skin: Negative for rash. Neurological: Negative for headaches, focal weakness or numbness.   ____________________________________________   PHYSICAL EXAM:  VITAL SIGNS: ED Triage Vitals  Enc Vitals Group     BP 01/13/17 0328 (!) 150/83     Pulse Rate 01/13/17 0328 91     Resp 01/13/17 0328 20     Temp 01/13/17 0328 97.7 F (36.5 C)     Temp Source 01/13/17 0328 Oral     SpO2 01/13/17 0328 98 %     Weight 01/13/17 0332 200 lb (90.7 kg)     Height 01/13/17 0332 5' 4"  (1.626 m)     Head Circumference --      Peak Flow --      Pain Score 01/13/17 0328 7  Pain Loc --      Pain Edu? --      Excl. in [place]? --     Constitutional: Alert and oriented. Well appearing and in no acute distress. Eyes: Conjunctivae are normal.  Head: Atraumatic. Nose: Clear rhinorrhea bilaterally Mouth/Throat: Mucous membranes are moist.  Neck: No stridor.   Cardiovascular: Normal rate, regular rhythm. Grossly normal heart sounds.  Chest pain is nonreproducible palpation. Respiratory: Normal respiratory effort.  No retractions. Lungs CTAB. Gastrointestinal: Soft and nontender. No distention. No CVA tenderness. Musculoskeletal: No lower  extremity tenderness nor edema.  No joint effusions. Neurologic:  Normal speech and language. No gross focal neurologic deficits are appreciated. Skin:  Skin is warm, dry and intact. No rash noted. Psychiatric: Mood and affect are normal. Speech and behavior are normal.  ____________________________________________   LABS (all labs ordered are listed, but only abnormal results are displayed)  Labs Reviewed - No data to display ____________________________________________  EKG   ____________________________________________  RADIOLOGY  No acute disease on the chest x-ray ____________________________________________   PROCEDURES  Procedure(s) performed:   Procedures  Critical Care performed:   ____________________________________________   INITIAL IMPRESSION / ASSESSMENT AND PLAN / ED COURSE  Pertinent labs & imaging results that were available during my care of the patient were reviewed by me and considered in my medical decision making (see chart for details).   Patient without any wheezing, respiratory distress. Very reassuring vital signs here in the emergency department. Likely viral syndrome versus URI. No signs of pneumonia but exposure 2. We'll discharge with Z-Pak as well as an inhaler for symptomatic treatment. Patient is understanding of this plan and willing to comply. Chest pain likely related to cough. I do not see any other signs of more serious pathology such as a pulmonary embolus or cardiac disease. Patient is understanding the plan and willing to comply.      ____________________________________________   FINAL CLINICAL IMPRESSION(S) / ED DIAGNOSES  Bronchitis. Viral syndrome.    NEW MEDICATIONS STARTED DURING THIS VISIT:  New Prescriptions   No medications on file     Note:  This document was prepared using Dragon voice recognition software and may include unintentional dictation errors.     Orbie Pyo, MD 01/13/17  [phone]

## 2017-01-13 NOTE — ED Notes (Signed)
Pt alert and oriented. NAD. Waiting on disposition. Respirations unlabored.

## 2017-01-13 NOTE — ED Triage Notes (Signed)
Pt complains of productive cough with green sputum, congestion and fever for 4 days, pt tool motrin prior to coming to ED

## 2017-03-09 ENCOUNTER — Emergency Department
Admission: EM | Admit: 2017-03-09 | Discharge: 2017-03-09 | Disposition: A | Discharge status: Home / Self Care | Payer: Self-pay | Attending: Emergency Medicine | Admitting: Emergency Medicine

## 2017-03-09 ENCOUNTER — Emergency Department: Payer: Self-pay

## 2017-03-09 ENCOUNTER — Encounter: Payer: Self-pay | Admitting: Emergency Medicine

## 2017-03-09 DIAGNOSIS — Z79899 Other long term (current) drug therapy: Secondary | ICD-10-CM | POA: Insufficient documentation

## 2017-03-09 DIAGNOSIS — F17210 Nicotine dependence, cigarettes, uncomplicated: Secondary | ICD-10-CM | POA: Insufficient documentation

## 2017-03-09 DIAGNOSIS — K529 Noninfective gastroenteritis and colitis, unspecified: Secondary | ICD-10-CM | POA: Insufficient documentation

## 2017-03-09 LAB — URINALYSIS, COMPLETE (UACMP) WITH MICROSCOPIC
BILIRUBIN URINE: NEGATIVE
Bacteria, UA: NONE SEEN
Glucose, UA: NEGATIVE mg/dL
KETONES UR: NEGATIVE mg/dL
Nitrite: NEGATIVE
PH: 6 (ref 5.0–8.0)
PROTEIN: NEGATIVE mg/dL
Specific Gravity, Urine: 1.008 (ref 1.005–1.030)

## 2017-03-09 LAB — GASTROINTESTINAL PANEL BY PCR, STOOL (REPLACES STOOL CULTURE)
ADENOVIRUS F40/41: NOT DETECTED
ASTROVIRUS: NOT DETECTED
CAMPYLOBACTER SPECIES: NOT DETECTED
CYCLOSPORA CAYETANENSIS: NOT DETECTED
Cryptosporidium: NOT DETECTED
ENTAMOEBA HISTOLYTICA: NOT DETECTED
ENTEROPATHOGENIC E COLI (EPEC): NOT DETECTED
ENTEROTOXIGENIC E COLI (ETEC): NOT DETECTED
Enteroaggregative E coli (EAEC): NOT DETECTED
Giardia lamblia: NOT DETECTED
NOROVIRUS GI/GII: NOT DETECTED
Plesimonas shigelloides: NOT DETECTED
Rotavirus A: NOT DETECTED
Salmonella species: DETECTED — AB
Sapovirus (I, II, IV, and V): NOT DETECTED
Shiga like toxin producing E coli (STEC): NOT DETECTED
Shigella/Enteroinvasive E coli (EIEC): NOT DETECTED
VIBRIO CHOLERAE: NOT DETECTED
Vibrio species: NOT DETECTED
Yersinia enterocolitica: NOT DETECTED

## 2017-03-09 LAB — CBC
HCT: 39.5 % (ref 35.0–47.0)
HEMOGLOBIN: 13.8 g/dL (ref 12.0–16.0)
MCH: 30.8 pg (ref 26.0–34.0)
MCHC: 35 g/dL (ref 32.0–36.0)
MCV: 88.1 fL (ref 80.0–100.0)
Platelets: 228 10*3/uL (ref 150–440)
RBC: 4.48 MIL/uL (ref 3.80–5.20)
RDW: 13.7 % (ref 11.5–14.5)
WBC: 8.7 10*3/uL (ref 3.6–11.0)

## 2017-03-09 LAB — COMPREHENSIVE METABOLIC PANEL
ALBUMIN: 3.8 g/dL (ref 3.5–5.0)
ALK PHOS: 62 U/L (ref 38–126)
ALT: 14 U/L (ref 14–54)
ANION GAP: 6 (ref 5–15)
AST: 26 U/L (ref 15–41)
BUN: <5 mg/dL — ABNORMAL LOW (ref 6–20)
CALCIUM: 8.7 mg/dL — AB (ref 8.9–10.3)
CHLORIDE: 109 mmol/L (ref 101–111)
CO2: 22 mmol/L (ref 22–32)
Creatinine, Ser: 0.73 mg/dL (ref 0.44–1.00)
GFR calc non Af Amer: >60 mL/min (ref >60)
GLUCOSE: 110 mg/dL — AB (ref 65–99)
Potassium: 3.3 mmol/L — ABNORMAL LOW (ref 3.5–5.1)
SODIUM: 137 mmol/L (ref 135–145)
Total Bilirubin: 0.4 mg/dL (ref 0.3–1.2)
Total Protein: 7.3 g/dL (ref 6.5–8.1)

## 2017-03-09 LAB — LIPASE, BLOOD: LIPASE: 17 U/L (ref 11–51)

## 2017-03-09 LAB — PREGNANCY, URINE: Preg Test, Ur: NEGATIVE

## 2017-03-09 MED ORDER — METRONIDAZOLE 500 MG PO TABS: 500.0000 mg | ORAL_TABLET | Freq: Three times a day (TID) | ORAL | 0 refills | Status: DC

## 2017-03-09 MED ORDER — METRONIDAZOLE 500 MG PO TABS
500.0000 mg | ORAL_TABLET | Freq: Once | ORAL | Status: AC
  Administered 2017-03-09: 500 mg via ORAL
  Filled 2017-03-09: qty 1

## 2017-03-09 MED ORDER — LEVOFLOXACIN 750 MG PO TABS
750.0000 mg | ORAL_TABLET | Freq: Once | ORAL | Status: AC
  Administered 2017-03-09: 750 mg via ORAL
  Filled 2017-03-09: qty 1

## 2017-03-09 MED ORDER — POTASSIUM CHLORIDE CRYS ER 20 MEQ PO TBCR
40.0000 meq | EXTENDED_RELEASE_TABLET | Freq: Once | ORAL | Status: AC
  Administered 2017-03-09: 40 meq via ORAL
  Filled 2017-03-09: qty 2

## 2017-03-09 MED ORDER — LEVOFLOXACIN 750 MG PO TABS: 750.0000 mg | ORAL_TABLET | Freq: Every day | ORAL | 0 refills | Status: DC

## 2017-03-09 MED ORDER — IOPAMIDOL (ISOVUE-300) INJECTION 61%
100.0000 mL | Freq: Once | INTRAVENOUS | Status: AC | PRN
  Administered 2017-03-09: 100 mL via INTRAVENOUS

## 2017-03-09 MED ORDER — IOPAMIDOL (ISOVUE-300) INJECTION 61%
30.0000 mL | Freq: Once | INTRAVENOUS | Status: AC
  Administered 2017-03-09: 30 mL via ORAL

## 2017-03-09 MED ORDER — LOPERAMIDE HCL 2 MG PO CAPS
2.0000 mg | ORAL_CAPSULE | ORAL | Status: AC
  Administered 2017-03-09: 2 mg via ORAL
  Filled 2017-03-09: qty 1

## 2017-03-09 MED ORDER — SODIUM CHLORIDE 0.9 % IV BOLUS (SEPSIS)
1000.0000 mL | Freq: Once | INTRAVENOUS | Status: AC
  Administered 2017-03-09: 1000 mL via INTRAVENOUS

## 2017-03-09 MED ORDER — MORPHINE SULFATE (PF) 4 MG/ML IV SOLN
4.0000 mg | Freq: Once | INTRAVENOUS | Status: AC
  Administered 2017-03-09: 4 mg via INTRAVENOUS
  Filled 2017-03-09: qty 1

## 2017-03-09 MED ORDER — ONDANSETRON HCL 4 MG/2ML IJ SOLN
4.0000 mg | Freq: Once | INTRAMUSCULAR | Status: AC
  Administered 2017-03-09: 4 mg via INTRAVENOUS
  Filled 2017-03-09: qty 2

## 2017-03-09 NOTE — ED Notes (Signed)
Hat is in place on the toilet. Patient denies the need for a BM at this time.

## 2017-03-09 NOTE — ED Provider Notes (Signed)
[facility] Emergency Department Provider Note   ____________________________________________   First MD Initiated Contact with Patient 03/09/17 1118     (approximate)  I have reviewed the triage vital signs and the nursing notes.   HISTORY  Chief Complaint Abdominal Pain    HPI Renee Norman is a 26 y.o. female for evaluation of right-sided abdomi and diarrhea  Been experiencing symptoms about 1 day ago. Reports intermittent and severe cr and associated with frequent watery No blood in her stool. Occasional nausea but no vomiting Today, but she did vomit many tiem She's been able to keep soup down yesterday and fluids this morning. Reports mild nausea but significant crampy and intermittent abdominal pain, and had lots of watery diarrhea yesterday day but this is now improving having had only a couple of loose bowel movements today.  Denies any vaginal discharge. No  No recent travel. No known sick  Contacts.  Reports moderate crampy pain primar No history of previous abdominal  She has not taken any medicine to a Nothing seems to make it worse ot diarrhea shortly after  denies history of HIV. She and partner for previous STD testing.  History reviewed. No pertinent past medical history.  There are no active problems to display for this patient.   Past Surgical History:  Procedure Laterality Date  . TONSILLECTOMY      Prior to Admission medications   Medication Sig Start Date End Date Taking? Authorizing Provider  albuterol (PROVENTIL HFA;VENTOLIN HFA) 108 (90 Base) MCG/ACT inhaler Inhale 2 puffs into the lungs every 6 (six) hours as needed. 01/13/17  Yes Schaevitz, Randall An, MD  benzonatate (TESSALON PERLES) 100 MG capsule Take 1 capsule (100 mg total) by mouth 3 (three) times daily as needed for cough. Patient not taking: Reported on 01/13/2017 10/04/15   Sable Feil, PA-C  fexofenadine-pseudoephedrine (ALLEGRA-D) 60-120 MG 12 hr  tablet Take 1 tablet by mouth 2 (two) times daily. Patient not taking: Reported on 01/13/2017 10/04/15   Sable Feil, PA-C  guaiFENesin-codeine [facility]) 100-10 MG/5ML syrup Take 5 mLs by mouth 3 (three) times daily as needed for cough. Patient not taking: Reported on 01/13/2017 09/25/15   Sherrie George B, FNP  ibuprofen (ADVIL,MOTRIN) 600 MG tablet Take 1 tablet (600 mg total) by mouth every 6 (six) hours as needed for moderate pain. Patient not taking: Reported on 03/09/2017 12/16/14   Joanne Gavel, MD  levofloxacin (LEVAQUIN) 750 MG tablet Take 1 tablet (750 mg total) by mouth daily. 03/09/17   Delman Kitten, MD  metroNIDAZOLE (FLAGYL) 500 MG tablet Take 1 tablet (500 mg total) by mouth 3 (three) times daily. 03/09/17   Delman Kitten, MD    Allergies Patient has no known allergies.  History reviewed. No pertinent family history.  Social History Social History  Substance Use Topics  . Smoking status: Current Every Day Smoker    Packs/day: 0.50    Types: Cigarettes  . Smokeless tobacco: Never Used  . Alcohol use Yes     Comment: occassionally    Review of Systems Constitutional: No fever/chills Eyes: No visual changes. ENT: No sore throat. Cardiovascular: Denies chest pain. Respiratory: Denies shortness of breath. Gastrointestinal:   No constipation. Genitourinary: Negative for dysuria. Musculoskeletal: Negative for back pain. Skin: Negative for rash. Neurological: Negative for headaches, focal weakness or numbness.    ____________________________________________   PHYSICAL EXAM:  VITAL SIGNS: ED Triage Vitals  Enc Vitals Group     BP 03/09/17  1000 135/79     Pulse Rate 03/09/17 1000 73     Resp 03/09/17 1000 20     Temp 03/09/17 1000 98.7 F (37.1 C)     Temp Source 03/09/17 1000 Oral     SpO2 03/09/17 1000 99 %     Weight 03/09/17 1001 200 lb (90.7 kg)     Height --      Head Circumference --      Peak Flow --      Pain Score 03/09/17 1000 10     Pain Loc  --      Pain Edu? --      Excl. in [place]? --     Constitutional: Alert and oriented. Well appearing and in no acute distress. Eyes: Conjunctivae are normal. Head: Atraumatic. Nose: No congestion/rhinnorhea. Mouth/Throat: Mucous membranes are moist. Neck: No stridor.   Cardiovascular: Normal rate, regular rhythm. Grossly normal heart sounds.  Good peripheral circulation. Respiratory: Normal respiratory effort.  No retractions. Lungs CTAB. Gastrointestinal: Soft and nontender. No distention. Musculoskeletal: No lower extremity tenderness nor edema. Neurologic:  Normal speech and language. No gross focal neurologic deficits are appreciated.  Skin:  Skin is warm, dry and intact. No rash noted. Psychiatric: Mood and affect are normal. Speech and behavior are normal.  ____________________________________________   LABS (all labs ordered are listed, but only abnormal results are displayed)  Labs Reviewed  GASTROINTESTINAL PANEL BY PCR, STOOL (REPLACES STOOL CULTURE) - Abnormal; Notable for the following:       Result Value   Salmonella species DETECTED (*)    All other components within normal limits  COMPREHENSIVE METABOLIC PANEL - Abnormal; Notable for the following:    Potassium 3.3 (*)    Glucose, Bld 110 (*)    BUN <5 (*)    Calcium 8.7 (*)    All other components within normal limits  URINALYSIS, COMPLETE (UACMP) WITH MICROSCOPIC - Abnormal; Notable for the following:    Color, Urine YELLOW (*)    APPearance CLEAR (*)    Hgb urine dipstick LARGE (*)    Leukocytes, UA TRACE (*)    Squamous Epithelial / LPF 0-5 (*)    All other components within normal limits  LIPASE, BLOOD  CBC  PREGNANCY, URINE   ____________________________________________  EKG   ____________________________________________  RADIOLOGY  Ct Abdomen Pelvis W Contrast  Result Date: 03/09/2017 CLINICAL DATA:  26 year old with acute onset of generalized abdominal pain, nausea, vomiting and diarrhea that  began yesterday. EXAM: CT ABDOMEN AND PELVIS WITH CONTRAST TECHNIQUE: Multidetector CT imaging of the abdomen and pelvis was performed using the standard protocol following bolus administration of intravenous contrast. CONTRAST:  18m ISOVUE-300 IOPAMIDOL INJECTION 61% IV. COMPARISON:  CT abdomen pelvis 12/16/2014. Pelvic ultrasound 12/16/2014. FINDINGS: Lower chest: Subsegmental foci of ground-glass opacity involving the right lower lobe. Visualized lung bases otherwise clear. Heart size normal. Small gas bubble in the independent portion of the right ventricle related to the IV injection. Hepatobiliary: Liver normal in size and appearance. Approximate 2 cm gallstone in the gallbladder without evidence of acute cholecystitis. No biliary ductal dilation. Pancreas: Normal in appearance without evidence of mass, ductal dilation, or inflammation. Spleen: Normal in size and appearance. (Splenic volume approximately 307 cc). Adrenals/Urinary Tract: Normal appearing adrenal glands. Kidneys normal in size and appearance without focal parenchymal abnormality. No evidence of urinary tract calculi or obstruction. Normal-appearing urinary bladder. Stomach/Bowel: Stomach normal in appearance for the degree of distention. Normal-appearing small bowel. Edema and thickening involving the  wall of the cecum, ascending colon, transverse colon, proximal descending colon, and rectum. The intervening mid and distal descending colon and sigmoid colon are normal in appearance. Normal appendix in the right mid abdomen as the cecum is located in the right upper quadrant. Small lipoma involving the ileocecal valve. Vascular/Lymphatic: No visible aortoiliofemoral atherosclerosis. Widely patent visceral arteries. Normal-appearing portal venous and systemic venous systems. No pathologic lymphadenopathy. Reproductive: Normal-appearing uterus and ovaries without evidence of adnexal mass. Other: Minimal ascites dependently in the pelvis.  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 No acute osseous abnormality. IMPRESSION: 1. Colitis involving the cecum, ascending colon, transverse colon, proximal descending colon and rectum with intervening normal mid and distal descending colon and sigmoid colon. This may be infectious or inflammatory in origin. 2. Minimal dependent ascites in the pelvis. 3. Normal-appearing small bowel. 4. Cholelithiasis without evidence of acute cholecystitis. 5. Subsegmental foci of ground-glass opacity in the right lower lobe, likely infectious or inflammatory. 6. 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 No acute osseous abnormality. Electronically Signed   By: Evangeline Dakin M.D.   On: 03/09/2017 13:54   ____________________________________________   PROCEDURES  Procedure(s) performed: None  Procedures  Critical Care performed: No  ____________________________________________   INITIAL IMPRESSION / ASSESSMENT AND PLAN / ED COURSE  Pertinent labs & imaging results that were available during my care of the patient were reviewed by me and considered in my medical decision making (see chart for details).  Differential diagnosis includes but is not limited to, abdominal perforation, aortic dissection, cholecystitis, appendicitis, diverticulitis, colitis, esophagitis/gastritis, kidney stone, pyelonephritis, urinary tract infection, aortic aneurysm. All are considered in decision and treatment plan. Based upon the patient's presentation and risk factors, abdomen most concern for possible infectious colitis, but also given the right-sided nature cholecystitis or less likely given symptom apology appendicitis and pyelonephritis are considered. We will proceed with CT scan     patient did have a single loose bowel movement that was nonbloody in the ER. She is awake and alert and feels improved, pain improved with  morphine nontoxic and ambulatory. Able tolerate by mouth and oral antibiotics. She appears stable for discharge, likely colitis of infectious etiology, stool culture is pending.she is nontoxic, appears appropriate for outpatient management. I will place her on oral antibiotics given the presentation which is suspicious for infectious etiology. Additionally, CT scan shows some mild inflammatory versus infectious changes in the lung base, though she denies respiratory symptoms I suspect this is more likely infectious in nature and unlikely to represent pneumonia. Lungs are clear, no shortness of breath with normal oxygen saturation. She is afebrile without evidence of sepsis.  ____________________________________________   FINAL CLINICAL IMPRESSION(S) / ED DIAGNOSES  Final diagnoses:  Colitis  salmonella    NEW MEDICATIONS STARTED DURING THIS VISIT:  Discharge Medication List as of 03/09/2017  2:44 PM    START taking these medications   Details  levofloxacin (LEVAQUIN) 750 MG tablet Take 1 tablet (750 mg total) by mouth daily., Starting Mon 03/09/2017, Print    metroNIDAZOLE (FLAGYL) 500 MG tablet Take 1 tablet (500 mg total) by mouth 3 (three) times daily., Starting Mon 03/09/2017, Print         Note:  This document was prepared using Dragon voice recognition software and may include unintentional dictation errors.  ----------------------------------------- 8:58 PM on 03/09/2017 -----------------------------------------  Called and updated patient results. She was sleeping, but her wife who is her partner answered the phone but asks  to allow patient to rest, and we discussed diagnosis of salmonella and continuation of antibiotics. They have filled the antibiotic Rx. She is currently resting, is reported feeling improved this evening but fatigued. Reviewed careful return precautions including return if she develops fever, worsening symptoms, vomiting is uncontrollable, confusion, significant  weakness, concerns for dehydration, or other new concerns arise.     Delman Kitten, MD 03/09/17 2100

## 2017-03-09 NOTE — Discharge Instructions (Signed)
Please return to the emergency room right away if you are to develop a fever, severe nausea, your pain becomes severe or worsens, you are unable to keep food down, begin vomiting any dark or bloody fluid, you develop any dark or bloody stools, feel dehydrated, or other new concerns or symptoms arise.

## 2017-03-09 NOTE — ED Notes (Signed)
Lab called with result of salmonella in urine; MD Quale notified

## 2017-03-09 NOTE — ED Notes (Signed)
Patient states she feels like she could have a BM, but once on the commode cannot have a  BM.

## 2017-03-09 NOTE — ED Notes (Signed)
Patient is on second bottle of contrast. Patient reports no need to have a BM at this time.

## 2017-03-09 NOTE — ED Triage Notes (Signed)
Pt to ed with c/o abd pain and n/v/d since yesterday,  Reports diarrhea x 20 in the last 24 hours, and vomiting x 2 in the last 24 hours.

## 2017-07-19 ENCOUNTER — Emergency Department: Payer: 59

## 2017-07-19 ENCOUNTER — Emergency Department
Admission: EM | Admit: 2017-07-19 | Discharge: 2017-07-19 | Disposition: A | Discharge status: Home / Self Care | Payer: 59 | Attending: Emergency Medicine | Admitting: Emergency Medicine

## 2017-07-19 ENCOUNTER — Other Ambulatory Visit: Payer: Self-pay

## 2017-07-19 ENCOUNTER — Encounter: Payer: Self-pay | Admitting: Emergency Medicine

## 2017-07-19 DIAGNOSIS — N760 Acute vaginitis: Secondary | ICD-10-CM | POA: Insufficient documentation

## 2017-07-19 DIAGNOSIS — B9689 Other specified bacterial agents as the cause of diseases classified elsewhere: Secondary | ICD-10-CM

## 2017-07-19 DIAGNOSIS — N3000 Acute cystitis without hematuria: Secondary | ICD-10-CM

## 2017-07-19 DIAGNOSIS — N939 Abnormal uterine and vaginal bleeding, unspecified: Secondary | ICD-10-CM | POA: Insufficient documentation

## 2017-07-19 DIAGNOSIS — F17210 Nicotine dependence, cigarettes, uncomplicated: Secondary | ICD-10-CM | POA: Diagnosis not present

## 2017-07-19 DIAGNOSIS — E039 Hypothyroidism, unspecified: Secondary | ICD-10-CM | POA: Insufficient documentation

## 2017-07-19 HISTORY — DX: Celiac disease: K90.0

## 2017-07-19 HISTORY — DX: Hypothyroidism, unspecified: E03.9

## 2017-07-19 LAB — URINALYSIS, COMPLETE (UACMP) WITH MICROSCOPIC
BILIRUBIN URINE: NEGATIVE
GLUCOSE, UA: NEGATIVE mg/dL
KETONES UR: NEGATIVE mg/dL
NITRITE: NEGATIVE
PH: 6 (ref 5.0–8.0)
PROTEIN: NEGATIVE mg/dL
Specific Gravity, Urine: 1.002 — ABNORMAL LOW (ref 1.005–1.030)

## 2017-07-19 LAB — BASIC METABOLIC PANEL
ANION GAP: 7 (ref 5–15)
BUN: 7 mg/dL (ref 6–20)
CALCIUM: 8.6 mg/dL — AB (ref 8.9–10.3)
CO2: 24 mmol/L (ref 22–32)
Chloride: 107 mmol/L (ref 101–111)
Creatinine, Ser: 0.69 mg/dL (ref 0.44–1.00)
GLUCOSE: 102 mg/dL — AB (ref 65–99)
POTASSIUM: 3.8 mmol/L (ref 3.5–5.1)
Sodium: 138 mmol/L (ref 135–145)

## 2017-07-19 LAB — HCG, QUANTITATIVE, PREGNANCY: hCG, Beta Chain, Quant, S: <1 m[IU]/mL (ref <5)

## 2017-07-19 LAB — CHLAMYDIA/NGC RT PCR (ARMC ONLY)
Chlamydia Tr: NOT DETECTED
N gonorrhoeae: NOT DETECTED

## 2017-07-19 LAB — WET PREP, GENITAL
SPERM: NONE SEEN
TRICH WET PREP: NONE SEEN
Yeast Wet Prep HPF POC: NONE SEEN

## 2017-07-19 LAB — CBC
HEMATOCRIT: 39.6 % (ref 35.0–47.0)
Hemoglobin: 13.5 g/dL (ref 12.0–16.0)
MCH: 30.8 pg (ref 26.0–34.0)
MCHC: 34.1 g/dL (ref 32.0–36.0)
MCV: 90.3 fL (ref 80.0–100.0)
Platelets: 232 10*3/uL (ref 150–440)
RBC: 4.38 MIL/uL (ref 3.80–5.20)
RDW: 14.1 % (ref 11.5–14.5)
WBC: 9.3 10*3/uL (ref 3.6–11.0)

## 2017-07-19 LAB — ABO/RH: ABO/RH(D): O POS

## 2017-07-19 MED ORDER — METRONIDAZOLE 500 MG PO TABS: 500.0000 mg | ORAL_TABLET | Freq: Two times a day (BID) | ORAL | 0 refills | Status: DC

## 2017-07-19 MED ORDER — NITROFURANTOIN MONOHYD MACRO 100 MG PO CAPS: 100.0000 mg | ORAL_CAPSULE | Freq: Two times a day (BID) | ORAL | 0 refills | Status: AC

## 2017-07-19 NOTE — ED Provider Notes (Signed)
[facility] Emergency Department Provider Note  ____________________________________________  Time seen: Approximately 7:42 PM  I have reviewed the triage vital signs and the nursing notes.   HISTORY  Chief Complaint Vaginal Bleeding    HPI Renee Norman is a 27 y.o. female G1P0 approximately 4wks 4d pregnant by "in home artificial insemination" presenting w/ vaginal bleeding.  The pt reports 4 pos pregnancy tests at home.  Last week, had 1 night of minimal vaginal spotting with complete resolution.  Today, had increased vaginal bleeding w/o clots or passage of tissue; less than normal menstrual period.  No assoc cramping, lightheadedness, sob, change in vaginal discharge or fever.   Past Medical History:  Diagnosis Date  . Celiac disease   . Hypothyroid     There are no active problems to display for this patient.   Past Surgical History:  Procedure Laterality Date  . TONSILLECTOMY      Current Outpatient Rx  . Order #: [id number] Class: Print  . Order #: [id number] Class: Print  . Order #: [id number] Class: Print  . Order #: [id number] Class: Print  . Order #: [id number] Class: Print  . Order #: [id number] Class: Print  . Order #: [id number] Class: Print  . Order #: [id number] Class: Print    Allergies Patient has no known allergies.  History reviewed. No pertinent family history.  Social History Social History   Tobacco Use  . Smoking status: Current Every Day Smoker    Packs/day: 0.50    Types: Cigarettes  . Smokeless tobacco: Never Used  Substance Use Topics  . Alcohol use: No    Frequency: Never    Comment: occassionally  . Drug use: Yes    Types: Marijuana    Review of Systems Constitutional: No fever/chills. No lightheadedness or syncope Eyes: No eye discharge ENT:  No congestion or rhinorrhea. Respiratory: Denies shortness of breath.  No cough. Gastrointestinal: No abdominal pain.  No nausea, no vomiting.  No diarrhea.  No  constipation. Genitourinary: Negative for dysuria. + vaginal bleeding.  No change in vag discharge. Musculoskeletal: Negative for back pain. Skin: Negative for rash. Neurological: Negative for headaches. No focal numbness, tingling or weakness.     ____________________________________________   PHYSICAL EXAM:  VITAL SIGNS: ED Triage Vitals  Enc Vitals Group     BP 07/19/17 1901 (!) 169/89     Pulse Rate 07/19/17 1901 72     Resp 07/19/17 1901 18     Temp 07/19/17 1901 98.4 F (36.9 C)     Temp Source 07/19/17 1901 Oral     SpO2 07/19/17 1901 100 %     Weight 07/19/17 1900 200 lb (90.7 kg)     Height 07/19/17 1900 5' 4"  (1.626 m)     Head Circumference --      Peak Flow --      Pain Score --      Pain Loc --      Pain Edu? --      Excl. in [place]? --     Constitutional: Alert and oriented. Well appearing and in no acute distress. Answers questions appropriately. Eyes: Conjunctivae are normal.  EOMI. No scleral icterus. Head: Atraumatic. Nose: No congestion/rhinnorhea. Mouth/Throat: Mucous membranes are moist.  Neck: No stridor.  Supple.  No meningismus. Cardiovascular: Normal rate, regular rhythm. No murmurs, rubs or gallops.  Respiratory: Normal respiratory effort.  No accessory muscle use or retractions. Lungs CTAB.  No wheezes, rales or ronchi. Gastrointestinal: Obese. Soft, nontender and nondistended.  No guarding or rebound.  No peritoneal signs. Genitourinary: Normal-appearing external genitalia without lesions. Vaginal exam with mild vagnial bleeding and minimal clear mucous discharge, normal-appearing cervix, normal vaginal wall tissue. Bimanual exam is negative for CMT, adnexal tenderness to palpation, no palpable masses. Cervix is closed. Musculoskeletal: No LE edema. No ttp in the calves or palpable cords.  Negative Homan's sign. Neurologic:  A&Ox3.  Speech is clear.  Face and smile are symmetric.  EOMI.  Moves all extremities well. Skin:  Skin is warm, dry and  intact. No rash noted. Psychiatric: Mood and affect are normal. Speech and behavior are normal.  Normal judgement.  ____________________________________________   LABS (all labs ordered are listed, but only abnormal results are displayed)  Labs Reviewed  WET PREP, GENITAL - Abnormal; Notable for the following components:      Result Value   Clue Cells Wet Prep HPF POC PRESENT (*)    WBC, Wet Prep HPF POC MODERATE (*)    All other components within normal limits  BASIC METABOLIC PANEL - Abnormal; Notable for the following components:   Glucose, Bld 102 (*)    Calcium 8.6 (*)    All other components within normal limits  URINALYSIS, COMPLETE (UACMP) WITH MICROSCOPIC - Abnormal; Notable for the following components:   Color, Urine STRAW (*)    APPearance HAZY (*)    Specific Gravity, Urine 1.002 (*)    Hgb urine dipstick LARGE (*)    Leukocytes, UA SMALL (*)    Bacteria, UA RARE (*)    Squamous Epithelial / LPF 0-5 (*)    All other components within normal limits  CHLAMYDIA/NGC RT PCR ([facility] ONLY)  HCG, QUANTITATIVE, PREGNANCY  CBC  ABO/RH   ____________________________________________  EKG  Not indicated ____________________________________________  RADIOLOGY  US Ob Comp Less 14 Wks  Result Date: 07/19/2017 CLINICAL DATA:  Vaginal bleeding since today.  Patient is pregnant. EXAM: OBSTETRIC <14 WK [place] AND TRANSVAGINAL OB US TECHNIQUE: Both transabdominal and transvaginal ultrasound examinations were performed for complete evaluation of the gestation as well as the maternal uterus, adnexal regions, and pelvic cul-de-sac. Transvaginal technique was performed to assess early pregnancy. COMPARISON:  None. FINDINGS: Intrauterine gestational sac: None Yolk sac:  N/A Embryo:  N/A Cardiac Activity: N/A Heart Rate: N/A bpm Subchorionic hemorrhage:  N/A Maternal uterus/adnexae: Both ovaries are normal. No free pelvic fluid collections. IMPRESSION: 1. No intrauterine gestational  sac is identified. Normal endometrial stripe. 2. Normal ovaries. 3. No free pelvic fluid collections. Electronically Signed   By: Marijo Sanes M.D.   On: 07/19/2017 20:23   US Ob Transvaginal  Result Date: 07/19/2017 CLINICAL DATA:  Vaginal bleeding since today.  Patient is pregnant. EXAM: OBSTETRIC <14 WK [place] AND TRANSVAGINAL OB US TECHNIQUE: Both transabdominal and transvaginal ultrasound examinations were performed for complete evaluation of the gestation as well as the maternal uterus, adnexal regions, and pelvic cul-de-sac. Transvaginal technique was performed to assess early pregnancy. COMPARISON:  None. FINDINGS: Intrauterine gestational sac: None Yolk sac:  N/A Embryo:  N/A Cardiac Activity: N/A Heart Rate: N/A bpm Subchorionic hemorrhage:  N/A Maternal uterus/adnexae: Both ovaries are normal. No free pelvic fluid collections. IMPRESSION: 1. No intrauterine gestational sac is identified. Normal endometrial stripe. 2. Normal ovaries. 3. No free pelvic fluid collections. Electronically Signed   By: Marijo Sanes M.D.   On: 07/19/2017 20:23    ____________________________________________   PROCEDURES  Procedure(s) performed: None  Procedures  Critical Care performed: No ____________________________________________   INITIAL IMPRESSION / ASSESSMENT AND PLAN / ED  COURSE  Pertinent labs & imaging results that were available during my care of the patient were reviewed by me and considered in my medical decision making (see chart for details).  27 y.o. G1 P0 approximate [redacted] weeks pregnant presenting with vaginal bleeding without any other symptoms.  Overall, the patient is hypertensive but otherwise hemodynamically stable and afebrile.  Her vaginal examination does show some mild bleeding.  Differential includes ectopic pregnancy, threatened abortion, subchorionic hemorrhage, or intravaginal infection.  I am awaiting the results of her hCG, and her pelvic ultrasound, as well as wet prep and  cultures.  Her CBC does not show any significant anemia, and her elect lites are reassuring.  Plan reevaluation for final disposition.  ____________________________________________  FINAL CLINICAL IMPRESSION(S) / ED DIAGNOSES  Final diagnoses:  Bacterial vaginosis  Acute cystitis without hematuria  Vaginal bleeding         NEW MEDICATIONS STARTED DURING THIS VISIT:  Discharge Medication List as of 07/19/2017  9:19 PM    START taking these medications   Details  nitrofurantoin, macrocrystal-monohydrate, (MACROBID) 100 MG capsule Take 1 capsule (100 mg total) by mouth 2 (two) times daily for 7 days., Starting Sun 07/19/2017, Until Sun 07/26/2017, Print          Eula Listen, MD 07/19/17 2255

## 2017-07-19 NOTE — Discharge Instructions (Signed)
Please return to the emergency department for severe pain, lightheadedness or fainting, fever, nausea or vomiting, or any other symptoms concerning to you.

## 2017-07-19 NOTE — ED Notes (Signed)
Patient states that she has had 4 positive home pregnancy test this week. Patient reports that she started having some vaginal bleeding. Patient reports that it is on the toilet paper when she wipes. Patient denies abdominal pain but states that she has had lower back pain over the past 2 days.

## 2017-07-19 NOTE — ED Triage Notes (Signed)
First Nurse Note:  Arrives, stating that she has taken 2 home pregnancy tests, and noticed vaginal bleeding today.  Patient is AAOx3.  Skin warm and dry.  Tearful.  Reassurance given.

## 2017-07-19 NOTE — ED Triage Notes (Addendum)
Pt reports several home positive pregnancy tests and now started with vaginal bleeding. More than spotting per pt but not normal period.  No abdominal pain or cramping.  ambulatory without distress. G1

## 2017-08-28 ENCOUNTER — Encounter: Payer: Self-pay | Admitting: Emergency Medicine

## 2017-08-28 ENCOUNTER — Emergency Department
Admission: EM | Admit: 2017-08-28 | Discharge: 2017-08-28 | Discharge status: Against Medical Advice | Payer: 59 | Attending: Emergency Medicine | Admitting: Emergency Medicine

## 2017-08-28 DIAGNOSIS — R51 Headache: Secondary | ICD-10-CM | POA: Insufficient documentation

## 2017-08-28 DIAGNOSIS — H5712 Ocular pain, left eye: Secondary | ICD-10-CM | POA: Insufficient documentation

## 2017-08-28 DIAGNOSIS — Z5321 Procedure and treatment not carried out due to patient leaving prior to being seen by health care provider: Secondary | ICD-10-CM | POA: Diagnosis not present

## 2017-08-28 NOTE — ED Triage Notes (Signed)
Pt comes into the ED via POV c/o headache x a couple of days and now eye swelling and pain in the left eye.  Denies any shortness of breath, fevers, chest pain.  Patient has photosensitivity associated with the headache and denies any h/o migraines.  Patient in NAd at this time with even and unlabored respirations and is neurologically intact.

## 2017-08-28 NOTE — ED Triage Notes (Signed)
First Nurse Note:  C/O left forehead headache and left eye swelling.  Headaches have been intermittent x 1 week, eye swelling started today.  Patient is AAOx3.  Skin warm and dry.  MAE equally and strong.  NAD

## 2017-09-11 ENCOUNTER — Other Ambulatory Visit: Payer: Self-pay

## 2017-09-11 DIAGNOSIS — E039 Hypothyroidism, unspecified: Secondary | ICD-10-CM | POA: Diagnosis not present

## 2017-09-11 DIAGNOSIS — Y9389 Activity, other specified: Secondary | ICD-10-CM | POA: Insufficient documentation

## 2017-09-11 DIAGNOSIS — F17210 Nicotine dependence, cigarettes, uncomplicated: Secondary | ICD-10-CM | POA: Insufficient documentation

## 2017-09-11 DIAGNOSIS — Y92410 Unspecified street and highway as the place of occurrence of the external cause: Secondary | ICD-10-CM | POA: Diagnosis not present

## 2017-09-11 DIAGNOSIS — S3992XA Unspecified injury of lower back, initial encounter: Secondary | ICD-10-CM | POA: Diagnosis present

## 2017-09-11 DIAGNOSIS — S39012A Strain of muscle, fascia and tendon of lower back, initial encounter: Secondary | ICD-10-CM | POA: Diagnosis not present

## 2017-09-11 DIAGNOSIS — Y999 Unspecified external cause status: Secondary | ICD-10-CM | POA: Diagnosis not present

## 2017-09-11 NOTE — ED Notes (Signed)
Offered pt ibuprofen for her pain but declined
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
## 2017-09-12 ENCOUNTER — Emergency Department
Admission: EM | Admit: 2017-09-12 | Discharge: 2017-09-12 | Disposition: A | Discharge status: Home / Self Care | Payer: 59 | Attending: Emergency Medicine | Admitting: Emergency Medicine

## 2017-09-12 DIAGNOSIS — S39012A Strain of muscle, fascia and tendon of lower back, initial encounter: Secondary | ICD-10-CM

## 2017-09-12 MED ORDER — IBUPROFEN 600 MG PO TABS: ORAL_TABLET | ORAL | 0 refills | Status: DC

## 2017-09-12 MED ORDER — CYCLOBENZAPRINE HCL 10 MG PO TABS
5.0000 mg | ORAL_TABLET | Freq: Once | ORAL | Status: AC
  Administered 2017-09-12: 5 mg via ORAL
  Filled 2017-09-12: qty 1

## 2017-09-12 MED ORDER — IBUPROFEN 600 MG PO TABS
600.0000 mg | ORAL_TABLET | Freq: Once | ORAL | Status: AC
  Administered 2017-09-12: 600 mg via ORAL
  Filled 2017-09-12: qty 1

## 2017-09-12 MED ORDER — CYCLOBENZAPRINE HCL 5 MG PO TABS: 5.0000 mg | ORAL_TABLET | Freq: Three times a day (TID) | ORAL | 0 refills | Status: DC | PRN
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
## 2017-10-24 ENCOUNTER — Encounter: Payer: Self-pay | Admitting: Emergency Medicine

## 2017-10-24 ENCOUNTER — Other Ambulatory Visit: Payer: Self-pay

## 2017-10-24 ENCOUNTER — Emergency Department: Payer: 59

## 2017-10-24 ENCOUNTER — Emergency Department
Admission: EM | Admit: 2017-10-24 | Discharge: 2017-10-24 | Disposition: A | Discharge status: Home / Self Care | Payer: 59 | Attending: Emergency Medicine | Admitting: Emergency Medicine

## 2017-10-24 DIAGNOSIS — E039 Hypothyroidism, unspecified: Secondary | ICD-10-CM | POA: Diagnosis not present

## 2017-10-24 DIAGNOSIS — R05 Cough: Secondary | ICD-10-CM | POA: Diagnosis present

## 2017-10-24 DIAGNOSIS — J111 Influenza due to unidentified influenza virus with other respiratory manifestations: Secondary | ICD-10-CM | POA: Diagnosis not present

## 2017-10-24 DIAGNOSIS — F17210 Nicotine dependence, cigarettes, uncomplicated: Secondary | ICD-10-CM | POA: Insufficient documentation

## 2017-10-24 DIAGNOSIS — Z79899 Other long term (current) drug therapy: Secondary | ICD-10-CM | POA: Insufficient documentation

## 2017-10-24 DIAGNOSIS — R69 Illness, unspecified: Secondary | ICD-10-CM

## 2017-10-24 MED ORDER — GUAIFENESIN-CODEINE 100-10 MG/5ML PO SYRP: 5.0000 mL | ORAL_SOLUTION | Freq: Three times a day (TID) | ORAL | 0 refills | Status: AC | PRN

## 2017-10-24 MED ORDER — FLUTICASONE PROPIONATE 50 MCG/ACT NA SUSP: 2.0000 | Freq: Every day | NASAL | 0 refills | Status: DC

## 2017-10-24 MED ORDER — LIDOCAINE VISCOUS 2 % MT SOLN: 10.0000 mL | OROMUCOSAL | 0 refills | Status: DC | PRN

## 2017-10-24 MED ORDER — ALBUTEROL SULFATE HFA 108 (90 BASE) MCG/ACT IN AERS: 2.0000 | INHALATION_SPRAY | Freq: Four times a day (QID) | RESPIRATORY_TRACT | 0 refills | Status: AC | PRN

## 2017-10-24 MED ORDER — IPRATROPIUM-ALBUTEROL 0.5-2.5 (3) MG/3ML IN SOLN
3.0000 mL | Freq: Once | RESPIRATORY_TRACT | Status: AC
  Administered 2017-10-24: 3 mL via RESPIRATORY_TRACT
  Filled 2017-10-24: qty 3

## 2017-10-24 NOTE — ED Triage Notes (Signed)
C/O sinus congestion, cough, body aches x 2 days.  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  States vomited this morning.

## 2017-10-24 NOTE — ED Notes (Signed)
Pt c/o cough and congestion since Tuesday, pt states she has difficulty coughing up sputum at times. Pt states she does not have a hx of asthma. Pt states she has been using OTC medications without relief.  Pt states she is having sinus and facial pain as well. Wife also here to be seen for similar sxs.

## 2017-10-24 NOTE — ED Provider Notes (Signed)
[facility] Emergency Department Provider Note  ____________________________________________  Time seen: Approximately 11:20 AM  I have reviewed the triage vital signs and the nursing notes.   HISTORY  Chief Complaint Cough    HPI Renee Norman is a 27 y.o. female that presents to the emergency department for nasal congestion, facial pain, sore throat, non productive cough, body aches for 3 days. Patient had an episode of vomiting and diarrhea this morning. She has not checked her temperature but her hands are sweaty. There was a flu outbreak this week where she works. Wife also has cold symptoms.  She smokes half a pack of cigarettes per day.  No asthma or allergies.  No shortness of breath, chest pain, abdominal pain.   Past Medical History:  Diagnosis Date  . Celiac disease   . Hypothyroid     There are no active problems to display for this patient.   Past Surgical History:  Procedure Laterality Date  . TONSILLECTOMY      Prior to Admission medications   Medication Sig Start Date End Date Taking? Authorizing Provider  albuterol (PROVENTIL HFA;VENTOLIN HFA) 108 (90 Base) MCG/ACT inhaler Inhale 2 puffs into the lungs every 6 (six) hours as needed for wheezing or shortness of breath. 10/24/17   Laban Emperor, PA-C  benzonatate (TESSALON PERLES) 100 MG capsule Take 1 capsule (100 mg total) by mouth 3 (three) times daily as needed for cough. Patient not taking: Reported on 01/13/2017 10/04/15   Sable Feil, PA-C  cyclobenzaprine (FLEXERIL) 5 MG tablet Take 1 tablet (5 mg total) by mouth 3 (three) times daily as needed for muscle spasms. 09/12/17   Hinda Kehr, MD  fexofenadine-pseudoephedrine (ALLEGRA-D) 60-120 MG 12 hr tablet Take 1 tablet by mouth 2 (two) times daily. Patient not taking: Reported on 01/13/2017 10/04/15   Sable Feil, PA-C  fluticasone [facility]) 50 MCG/ACT nasal spray Place 2 sprays into both nostrils daily. 10/24/17 10/24/18   Laban Emperor, PA-C  guaiFENesin-codeine (ROBITUSSIN AC) 100-10 MG/5ML syrup Take 5 mLs by mouth 3 (three) times daily as needed for up to 2 days for cough. 10/24/17 10/26/17  Laban Emperor, PA-C  ibuprofen (ADVIL,MOTRIN) 600 MG tablet Take 1 tablet by mouth three times daily with meals 09/12/17   Hinda Kehr, MD  levofloxacin (LEVAQUIN) 750 MG tablet Take 1 tablet (750 mg total) by mouth daily. 03/09/17   Delman Kitten, MD  lidocaine (XYLOCAINE) 2 % solution Use as directed 10 mLs in the mouth or throat as needed for mouth pain. 10/24/17   Laban Emperor, PA-C  metroNIDAZOLE (FLAGYL) 500 MG tablet Take 1 tablet (500 mg total) by mouth 2 (two) times daily. 07/19/17   Eula Listen, MD    Allergies Gluten meal and Peanut-containing drug products  No family history on file.  Social History Social History   Tobacco Use  . Smoking status: Current Every Day Smoker    Packs/day: 0.50    Types: Cigarettes  . Smokeless tobacco: Never Used  Substance Use Topics  . Alcohol use: No    Frequency: Never    Comment: occassionally  . Drug use: Yes    Types: Marijuana     Review of Systems  IsEyes: No visual changes. No discharge. ENT: Positive for congestion and rhinorrhea. Cardiovascular: No chest pain. Respiratory: Positive for cough. No SOB. Gastrointestinal: No abdominal pain.  No nausea.  No constipation. Musculoskeletal: Positive for body aches.   Skin: Negative for rash, abrasions, lacerations, ecchymosis. Neurological: Negative for  headaches.   ____________________________________________   PHYSICAL EXAM:  VITAL SIGNS: ED Triage Vitals  Enc Vitals Group     BP 10/24/17 0856 132/84     Pulse Rate 10/24/17 0856 (!) 106     Resp 10/24/17 0856 18     Temp 10/24/17 0856 98 F (36.7 C)     Temp Source 10/24/17 0856 Oral     SpO2 10/24/17 0856 97 %     Weight 10/24/17 0859 170 lb (77.1 kg)     Height 10/24/17 0859 5' 4"  (1.626 m)     Head Circumference --      Peak Flow  --      Pain Score 10/24/17 0859 7     Pain Loc --      Pain Edu? --      Excl. in [place]? --      Constitutional: Alert and oriented. Well appearing and in no acute distress. Eyes: Conjunctivae are normal. PERRL. EOMI. No discharge. Head: Atraumatic. ENT:       Ears: Tympanic membranes pearly gray with good landmarks. No discharge.      Nose: Moderate congestion/rhinnorhea.      Mouth/Throat: Mucous membranes are moist. Oropharynx non-erythematous. Tonsils not enlarged. No exudates. Uvula midline. Neck: No stridor.   Hematological/Lymphatic/Immunilogical: No cervical lymphadenopathy. Cardiovascular: Normal rate, regular rhythm.  Good peripheral circulation. Respiratory: Normal respiratory effort without tachypnea or retractions. Lungs CTAB. Good air entry to the bases with no decreased or absent breath sounds. Gastrointestinal: Bowel sounds 4 quadrants. Soft and nontender to palpation. No guarding or rigidity. No palpable masses. No distention. Musculoskeletal: Full range of motion to all extremities. No gross deformities appreciated. Neurologic:  Normal speech and language. No gross focal neurologic deficits are appreciated.  Skin:  Skin is warm, dry and intact. No rash noted. Psychiatric: Mood and affect are normal. Speech and behavior are normal. Patient exhibits appropriate insight and judgement.   ____________________________________________   LABS (all labs ordered are listed, but only abnormal results are displayed)  Labs Reviewed - No data to display ____________________________________________  EKG   ____________________________________________  RADIOLOGY Robinette Haines, personally viewed and evaluated these images (plain radiographs) as part of my medical decision making, as well as reviewing the written report by the radiologist.  Dg Chest 2 View  Result Date: 10/24/2017 CLINICAL DATA:  Productive cough since 10/20/2017. EXAM: CHEST - 2 VIEW COMPARISON:  CT  chest 09/20/2014.  PA and lateral chest 01/13/2017. FINDINGS: Lungs clear. Heart size normal. No pneumothorax or pleural fluid. No bony abnormality. IMPRESSION: Normal chest. Electronically Signed   By: Inge Rise M.D.   On: 10/24/2017 10:25    ____________________________________________    PROCEDURES  Procedure(s) performed:    Procedures    Medications  ipratropium-albuterol (DUONEB) 0.5-2.5 (3) MG/3ML nebulizer solution 3 mL (3 mLs Nebulization Given 10/24/17 1000)     ____________________________________________   INITIAL IMPRESSION / ASSESSMENT AND PLAN / ED COURSE  Pertinent labs & imaging results that were available during my care of the patient were reviewed by me and considered in my medical decision making (see chart for details).  Review of the [place] CSRS was performed in accordance of the [place] prior to dispensing any controlled drugs.   Patient's diagnosis is consistent with influenza like illness. Vital signs and exam are reassuring. Chest xray is negative for acute cardiopulmonary processes. Patient felt better after duoneb. Patient has had several flu contacts this week and is outside of the window to begin tamiflu.  Patient appears well and is staying well hydrated. Patient should alternate tylenol and ibuprofen for fever. Patient feels comfortable going home. Patient will be discharged home with prescriptions for albuterol, robitussin, flonase, viscous lidocaine. Patient is to follow up with PCP as needed or otherwise directed. Patient is given ED precautions to return to the ED for any worsening or new symptoms.     ____________________________________________  FINAL CLINICAL IMPRESSION(S) / ED DIAGNOSES  Final diagnoses:  Influenza-like illness      NEW MEDICATIONS STARTED DURING THIS VISIT:  ED Discharge Orders        Ordered    albuterol (PROVENTIL HFA;VENTOLIN HFA) 108 (90 Base) MCG/ACT inhaler  Every 6 hours PRN     10/24/17 1114     guaiFENesin-codeine (ROBITUSSIN AC) 100-10 MG/5ML syrup  3 times daily PRN     10/24/17 1114    fluticasone (FLONASE) 50 MCG/ACT nasal spray  Daily     10/24/17 1114    lidocaine (XYLOCAINE) 2 % solution  As needed     10/24/17 1114          This chart was dictated using voice recognition software/Dragon. Despite best efforts to proofread, errors can occur which can change the meaning. Any change was purely unintentional.    Laban Emperor, PA-C 10/24/17 1147    Burlene Arnt Gerda Diss, MD 10/24/17 [phone]

## 2017-10-29 DIAGNOSIS — R87619 Unspecified abnormal cytological findings in specimens from cervix uteri: Secondary | ICD-10-CM

## 2017-10-29 HISTORY — DX: Unspecified abnormal cytological findings in specimens from cervix uteri: R87.619

## 2017-11-09 ENCOUNTER — Encounter: Payer: Self-pay | Admitting: Obstetrics & Gynecology

## 2017-12-01 ENCOUNTER — Encounter: Payer: Self-pay | Admitting: *Deleted

## 2017-12-03 ENCOUNTER — Ambulatory Visit: Payer: 59 | Admitting: Obstetrics and Gynecology

## 2017-12-03 ENCOUNTER — Other Ambulatory Visit: Payer: Self-pay | Admitting: Obstetrics and Gynecology

## 2017-12-03 ENCOUNTER — Encounter: Payer: Self-pay | Admitting: Obstetrics and Gynecology

## 2017-12-03 ENCOUNTER — Encounter (INDEPENDENT_AMBULATORY_CARE_PROVIDER_SITE_OTHER): Payer: Self-pay

## 2017-12-03 VITALS — BP 100/60 | HR 99 | Ht 65.0 in | Wt 192.0 lb

## 2017-12-03 DIAGNOSIS — Z3202 Encounter for pregnancy test, result negative: Secondary | ICD-10-CM

## 2017-12-03 DIAGNOSIS — R87613 High grade squamous intraepithelial lesion on cytologic smear of cervix (HGSIL): Secondary | ICD-10-CM | POA: Diagnosis not present

## 2017-12-03 LAB — POCT URINE PREGNANCY: Preg Test, Ur: NEGATIVE

## 2017-12-03 NOTE — Patient Instructions (Signed)
Colposcopy, Care After This sheet gives you information about how to care for yourself after your procedure. Your doctor may also give you more specific instructions. If you have problems or questions, contact your doctor. What can I expect after the procedure? If you did not have a tissue sample removed (did not have a biopsy), you may only have some spotting for a few days. You can go back to your normal activities. If you had a tissue sample removed, it is common to have:  Soreness and pain. This may last for a few days.  Light-headedness.  Mild bleeding from your vagina or dark-colored, grainy discharge from your vagina. This may last for a few days. You may need to wear a sanitary pad.  Spotting for at least 48 hours after the procedure.  Follow these instructions at home:  Take over-the-counter and prescription medicines only as told by your doctor. Ask your doctor what medicines you can start taking again. This is very important if you take blood-thinning medicine.  Do not drive or use heavy machinery while taking prescription pain medicine.  For 3 days, or as long as your doctor tells you, avoid: ? Douching. ? Using tampons. ? Having sex.  If you use birth control (contraception), keep using it.  Limit activity for the first day after the procedure. Ask your doctor what activities are safe for you.  It is up to you to get the results of your procedure. Ask your doctor when your results will be ready.  Keep all follow-up visits as told by your doctor. This is important. Contact a doctor if:  You get a skin rash. Get help right away if:  You are bleeding a lot from your vagina. It is a lot of bleeding if you are using more than one pad an hour for 2 hours in a row.  You have clumps of blood (blood clots) coming from your vagina.  You have a fever.  You have chills  You have pain in your lower belly (pelvic area).  You have signs of infection, such as vaginal  discharge that is: ? Different than usual. ? Yellow. ? Bad-smelling.  You have very pain or cramps in your lower belly that do not get better with medicine.  You feel light-headed.  You feel dizzy.  You pass out (faint). Summary  If you did not have a tissue sample removed (did not have a biopsy), you may only have some spotting for a few days. You can go back to your normal activities.  If you had a tissue sample removed, it is common to have mild pain and spotting for 48 hours.  For 3 days, or as long as your doctor tells you, avoid douching, using tampons and having sex.  Get help right away if you have bleeding, very bad pain, or signs of infection. This information is not intended to replace advice given to you by your health care provider. Make sure you discuss any questions you have with your health care provider. Document Released: 12/10/2007 Document Revised: 03/12/2016 Document Reviewed: 03/12/2016 Elsevier Interactive Patient Education  2018 [place] Electrosurgical Excision Procedure, Care After Refer to this sheet in the next few weeks. These instructions provide you with information about caring for yourself after your procedure. Your health care provider may also give you more specific instructions. Your treatment has been planned according to current medical practices, but problems sometimes occur. Call your health care provider if you have any problems or  questions after your procedure. What can I expect after the procedure? After the procedure, it is common to have:  Abdominal cramps that are similar to menstrual cramps. These may last for up to 1 week.  Pink-tinged or bloody vaginal discharge, including light to moderate bleeding, for 1-2 weeks.  A dark-colored vaginal discharge. This is from the paste that was applied to your cervix to control bleeding.  Follow these instructions at home: Activity  Return to your normal activities as told by your  health care provider. Ask your health care provider what activities are safe for you.  Avoid strenuous physical activity for as long as told by your health care provider.  Do not lift anything that is heavier than 10 lb (4.5 kg) until your health care provider says that it is safe. Bathing  Do not take baths, swim, or use a hot tub until your health care provider approves.  You may take showers. Lifestyle  Do not put anything in your vagina for 2 weeks after the procedure or until your health care provider says that it is okay. This includes tampons, creams, and douches.  Do not have sexual intercourse until your health care provider approves. General instructions  Take over-the-counter and prescription medicines only as told by your health care provider.  Keep all follow-up visits as told by your health care provider. This is important. Contact a health care provider if:  You have a fever or chills.  You feel unusually weak.  You have vaginal bleeding that is heavier or longer than a normal menstrual cycle. A sign of this can be soaking a pad with blood.  You have severe pain.  You have nausea or vomiting.  You develop a bad smelling vaginal discharge. This information is not intended to replace advice given to you by your health care provider. Make sure you discuss any questions you have with your health care provider. Document Released: 03/06/2011 Document Revised: 07/20/2015 Document Reviewed: 05/07/2015 Elsevier Interactive Patient Education  2018 [employer].

## 2017-12-03 NOTE — Progress Notes (Signed)
  Renee Norman 26 y.o. gravida 1 para 0-0-1-0 no obstetric history on file. 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  Discussed role for HPV in cervical dysplasia, need for surveillance. No prior abnormal Pap smears noted is been several years since her last Pap. Patient given informed consent, signed copy in the chart, time out was performed.  Placed in lithotomy position. Cervix viewed with speculum and colposcope after application of acetic acid.  LMP 25 Nov 2017, on no birth control Colposcopy adequate? Yes  acetowhite lesion(s) noted at 3:00 o'clock; biopsies obtained at 12:00 to 3:00 taken in 3 bites.   ECC specimen obtained.  Yes All specimens were labelled and sent to pathology.   Colposcopy IMPRESSION: CIN-1, at most  Patient was given post procedure instructions. Will follow up pathology and manage accordingly.  I did not see any areas that appeared high-grade routine preventative health maintenance measures emphasized.

## 2017-12-03 NOTE — Addendum Note (Signed)
Addended by: Diona Fanti A on: 12/03/2017 11:08 AM   Modules accepted: Orders

## 2017-12-14 ENCOUNTER — Telehealth: Payer: Self-pay | Admitting: *Deleted

## 2017-12-14 NOTE — Telephone Encounter (Signed)
Called patient regarding colpo but VM not set up.

## 2017-12-14 NOTE — Telephone Encounter (Signed)
Pt called requesting results from biopsy. DOB verified. Informed pt of abnormal results and advised to make an appt with Dr Glo Herring to discuss these results and treatment options. Pt verbalized understanding.

## 2017-12-21 ENCOUNTER — Encounter: Payer: Self-pay | Admitting: Obstetrics and Gynecology

## 2017-12-21 ENCOUNTER — Ambulatory Visit: Payer: 59 | Admitting: Obstetrics and Gynecology

## 2017-12-21 VITALS — BP 119/73 | HR 64 | Ht 63.0 in | Wt 194.6 lb

## 2017-12-21 DIAGNOSIS — N871 Moderate cervical dysplasia: Secondary | ICD-10-CM | POA: Diagnosis not present

## 2017-12-21 NOTE — Progress Notes (Signed)
Patient ID: Renee Norman, female   DOB: 06/20/1991, 27 y.o.   MRN: [id number]    [facility] Visit  @DATE @            Patient name: Renee Norman MRN [id number]  Date of birth: March 03, 1991  CC & HPI:  Renee Norman is a 27 y.o. female presenting today for a follow up for her biopsy results. She has had 2 miscarriages. She does not have any birth control method ; per patient is in monogamous lesbian relationship.  Patient denies fever, chills or any other symptoms or complaints at this time.  ROS:  ROS -fever -chills All systems are negative except as noted in the HPI and PMH.  Pertinent History Reviewed:   Reviewed: Significant for abnormal pap on cervix [facility]         Past Medical History:  Diagnosis Date   Abnormal Pap smear of cervix 10/29/2017   [facility]   Celiac disease    Depression    Hypertension    Hypothyroid                               Surgical Hx:    Past Surgical History:  Procedure Laterality Date   TONSILLECTOMY     Medications: Reviewed & Updated - see associated section                       Current Outpatient Medications:    albuterol (PROVENTIL HFA;VENTOLIN HFA) 108 (90 Base) MCG/ACT inhaler, Inhale 2 puffs into the lungs every 6 (six) hours as needed for wheezing or shortness of breath., Disp: 1 Inhaler, Rfl: 0   lisinopril (PRINIVIL,ZESTRIL) 5 MG tablet, Take 5 mg by mouth daily., Disp: , Rfl:    benzonatate (TESSALON PERLES) 100 MG capsule, Take 1 capsule (100 mg total) by mouth 3 (three) times daily as needed for cough. (Patient not taking: Reported on 01/13/2017), Disp: 15 capsule, Rfl: 0   cyclobenzaprine (FLEXERIL) 5 MG tablet, Take 1 tablet (5 mg total) by mouth 3 (three) times daily as needed for muscle spasms. (Patient not taking: Reported on 12/21/2017), Disp: 30 tablet, Rfl: 0   fexofenadine-pseudoephedrine (ALLEGRA-D) 60-120 MG 12 hr tablet, Take 1 tablet by mouth 2 (two) times daily. (Patient not taking: Reported  on 01/13/2017), Disp: 20 tablet, Rfl: 0   fluticasone (FLONASE) 50 MCG/ACT nasal spray, Place 2 sprays into both nostrils daily. (Patient not taking: Reported on 12/21/2017), Disp: 16 g, Rfl: 0   ibuprofen (ADVIL,MOTRIN) 600 MG tablet, Take 1 tablet by mouth three times daily with meals (Patient not taking: Reported on 12/21/2017), Disp: 15 tablet, Rfl: 0   levofloxacin (LEVAQUIN) 750 MG tablet, Take 1 tablet (750 mg total) by mouth daily. (Patient not taking: Reported on 12/03/2017), Disp: 7 tablet, Rfl: 0   lidocaine (XYLOCAINE) 2 % solution, Use as directed 10 mLs in the mouth or throat as needed for mouth pain. (Patient not taking: Reported on 12/03/2017), Disp: 100 mL, Rfl: 0   metroNIDAZOLE (FLAGYL) 500 MG tablet, Take 1 tablet (500 mg total) by mouth 2 (two) times daily. (Patient not taking: Reported on 12/03/2017), Disp: 14 tablet, Rfl: 0   Social History: Reviewed -  reports that she has been smoking cigarettes.  She has been smoking about 0.50 packs per day. She has never used smokeless tobacco.  Objective Findings:  Vitals: Blood pressure 119/73, pulse 64, height  5' 3"  (1.6 m), weight 194 lb 9.6 oz (88.3 kg), last menstrual period 11/26/2017.  PHYSICAL EXAMINATION General appearance - alert, well appearing, and in no distress and oriented to person, place, and time Mental status - alert, oriented to person, place, and time, normal mood, behavior, speech, dress, motor activity, and thought processes, affect appropriate to mood  PELVIC PELVIC DEFERRED  Assessment & Plan:   A:  1. FOCAL HIGH GRADE SQUAMOUS INTRAEPITHELIAL LESION, CIN-II/III  2. LOW GRADE SQUAMOUS INTRAEPITHELIAL LESION, CIN-I  P:  1. Cold knife  Conization will schedule in July.   By signing my name below, I, Renee Norman, attest that this documentation has been prepared under the direction and in the presence of Jonnie Kind, MD. Electronically Signed: [place]. 12/21/17. 9:53 AM.  I  personally performed the services described in this documentation, which was SCRIBED in my presence. The recorded information has been reviewed and considered accurate. It has been edited as necessary during review. Jonnie Kind, MD

## 2018-01-01 NOTE — Patient Instructions (Signed)
Renee Norman  01/01/2018     @PREFPERIOPPHARMACY @   Your procedure is scheduled on  01/12/2018   Report to [facility] at  615   A.M.  Call this number if you have problems the morning of surgery:  [phone]   Remember:  Do not eat or drink after midnight.  You may drink clear liquids until  12 midnight 01/11/2018  .  Clear liquids allowed are:                    Water, Juice (non-citric and without pulp), Carbonated beverages, Clear Tea, Black Coffee only, Plain Jell-O only, Gatorade and Plain Popsicles only    Take these medicines the morning of surgery with A SIP OF WATER  Lisinopril.    Do not wear jewelry, make-up or nail polish.  Do not wear lotions, powders, or perfumes, or deodorant.  Do not shave 48 hours prior to surgery.  Men may shave face and neck.  Do not bring valuables to the hospital.  [facility] is not responsible for any belongings or valuables.  Contacts, dentures or bridgework may not be worn into surgery.  Leave your suitcase in the car.  After surgery it may be brought to your room.  For patients admitted to the hospital, discharge time will be determined by your treatment team.  Patients discharged the day of surgery will not be allowed to drive home.   Name and phone number of your driver:   family Special instructions:  None  Please read over the following fact sheets that you were given. Anesthesia Post-op Instructions and Care and Recovery After Surgery       Cervical Conization Cervical conization (cone biopsy) is a procedure in which a cone-shaped portion of the cervix is cut out so that it can be examined under a microscope. The procedure is done to check for cancer cells or cells that might turn into cancer (precancerous cells). You may have this procedure if:  You have abnormal bleeding from your cervix.  You had an abnormal Pap test.  Something abnormal was seen on your cervix during an exam.  This procedure is  performed in either a health care provider's office or in an operating room. Tell a health care provider about:  Any allergies you have.  All medicines you are taking, including vitamins, herbs, eye drops, creams, and over-the-counter medicines.  Any problems you or family members have had with the use of anesthetic medicines.  Any blood disorders you have.  Any surgeries you have had.  Any medical conditions you have.  Your smoking habits.  When you normally have your period.  Whether you are pregnant or may be pregnant. What are the risks? Generally, this is a safe procedure. However, problems may occur, including:  Heavy bleeding for several days or weeks after the procedure.  Allergic reactions to medicines or dyes.  Increased risk of preterm labor in future pregnancies.  Infection (rare).  Damage to the cervix or other structures or organs (rare).  What happens before the procedure? Staying hydrated Follow instructions from your health care provider about hydration, which may include:  Up to 2 hours before the procedure - you may continue to drink clear liquids, such as water, clear fruit juice, black coffee, and plain tea.  Eating and drinking restrictions Follow instructions from your health care provider about eating and drinking, which may include:  8 hours before  the procedure - stop eating heavy meals or foods such as meat, fried foods, or fatty foods.  6 hours before the procedure - stop eating light meals or foods, such as toast or cereal.  6 hours before the procedure - stop drinking milk or drinks that contain milk.  2 hours before the procedure - stop drinking clear liquids.  General instructions  Do not douche, have sex, use tampons, or use any vaginal medicines before the procedure as told by your health care provider.  You may be asked to empty your bladder and bowel right before the procedure.  Ask your health care provider  about: ? Changing or stopping your normal medicines. This is important if you take diabetes medicines or blood thinners. ? Taking medicines such as aspirin and ibuprofen. These medicines can thin your blood. Do not take these medicines before your procedure if your doctor tells you not to.  Plan to have someone take you home from the hospital or clinic. What happens during the procedure?  To reduce your risk of infection: ? Your health care team will wash or sanitize their hands. ? Your skin will be washed with soap. ? Hair may be removed from the surgical area.  You will undress from the waist down and be given a gown to wear.  You will lie on an examining table and put your feet in stirrups.  An IV tube will be inserted into one of your veins.  You will be given one or more of the following: ? A medicine to help you relax (sedative). ? A medicine to numb the area (local anesthetic). ? A medicine to make you fall asleep (general anesthetic). ? A medicine that numbs the cervix (cervical block).  A lubricated device called a speculum will be inserted into your vagina. It will be used to spread open the walls of the vagina so your health care provider can see the inside of the vagina and cervix better.  An instrument that has a magnifying lens and a light (colposcope) will let your health care provider examine the cervix more closely.  Your health care provider will apply a solution to your cervix. This turns abnormal areas a pale color.  A tissue sample will be removed from the cervix using one of the following methods: ? The cold knife method. In this method, the tissue is cut out with a knife (scalpel). ? The loop electrosurgical excision procedure (LEEP) method. In this method, the tissue is cut out with a thin wire that can burn (cauterize) the tissue with an electrical current. ? Laser treatment method. In this method, the tissue is cut out and then cauterized with a laser beam  to prevent bleeding.  Your health care provider will apply a paste over the biopsy areas to help control bleeding.  The tissue sample will be examined under a microscope. The procedure may vary among health care providers and hospitals. What happens after the procedure?  Your blood pressure, heart rate, breathing rate, and blood oxygen level will be monitored often until the medicines you were given have worn off.  If you were given a local anesthetic, you will rest at the clinic or hospital until you are stable and feel ready to go home.  If you were given a general anesthetic, you may be monitored for a longer period of time.  You may have some cramping.  You may have bloody discharge or light to moderate bleeding.  You may have dark discharge  coming from your vagina. This is from the paste used on the cervix to prevent bleeding. Summary  Cervical conization is a procedure in which a cone-shaped portion of the cervix is cut out so that it can be examined under a microscope.  The procedure is done to check for cancer cells or cells that might turn into cancer (precancerous cells). This information is not intended to replace advice given to you by your health care provider. Make sure you discuss any questions you have with your health care provider. Document Released: 04/02/2005 Document Revised: 06/25/2016 Document Reviewed: 06/25/2016 Elsevier Interactive Patient Education  2017 [employer]  Cervical Conization, Care After This sheet gives you information about how to care for yourself after your procedure. Your doctor may also give you more specific instructions. If you have problems or questions, contact your doctor. Follow these instructions at home: Medicines  Take over-the-counter and prescription medicines only as told by your doctor.  Do not take aspirin until your doctor says it is okay.  If you take pain medicine: ? You may have constipation. To help treat this,  your doctor may tell you to:  Drink enough fluid to keep your pee (urine) clear or pale yellow.  Take medicines.  Eat foods that are high in fiber. These include fresh fruits and vegetables, whole grains, bran, and beans.  Limit foods that are high in fat and sugar. These include fried foods and sweet foods. ? Do not drive or use heavy machines. General instructions  You can eat your usual diet unless your doctor tells you not to do so.  Take showers for the first week. Do not take baths, swim, or use hot tubs until your doctor says it is okay.  Do not douche, use tampons, or have sex until your doctor says it is okay.  For 7-14 days after your procedure, avoid: ? Being very active. ? Exercising. ? Heavy lifting.  Keep all follow-up visits as told by your doctor. This is important. Contact a doctor if:  You have a rash.  You are dizzy or lightheaded.  You feel sick to your stomach (nauseous).  You throw up (vomit).  You have fluid from your vagina (vaginal discharge) that smells bad. Get help right away if:  There are blood clots coming from your vagina.  You have more bleeding than you would have in a normal period. For example, you soak a pad in less than 1 hour.  You have a fever.  You have more and more cramps.  You pass out (faint).  You have pain when peeing.  Your have a lot of pain.  Your pain gets worse.  Your pain does not get better when you take your medicine.  You have blood in your pee.  You throw up (vomit). Summary  After your procedure, take over-the-counter and prescription medicines only as told by your doctor.  Do not douche, use tampons, or have sex until your doctor says it is okay.  For about 7-14 days after your procedure, try not to exercise or lift heavy objects.  Get help right away if you have new symptoms, or if your symptoms become worse. This information is not intended to replace advice given to you by your health care  provider. Make sure you discuss any questions you have with your health care provider. Document Released: 04/01/2008 Document Revised: 06/25/2016 Document Reviewed: 06/25/2016 Elsevier Interactive Patient Education  2017 [place] Anesthesia, Adult General anesthesia is the  use of medicines to make a person "go to sleep" (be unconscious) for a medical procedure. General anesthesia is often recommended when a procedure:  Is long.  Requires you to be still or in an unusual position.  Is major and can cause you to lose blood.  Is impossible to do without general anesthesia.  The medicines used for general anesthesia are called general anesthetics. In addition to making you sleep, the medicines:  Prevent pain.  Control your blood pressure.  Relax your muscles.  Tell a health care provider about:  Any allergies you have.  All medicines you are taking, including vitamins, herbs, eye drops, creams, and over-the-counter medicines.  Any problems you or family members have had with anesthetic medicines.  Types of anesthetics you have had in the past.  Any bleeding disorders you have.  Any surgeries you have had.  Any medical conditions you have.  Any history of heart or lung conditions, such as heart failure, sleep apnea, or chronic obstructive pulmonary disease (COPD).  Whether you are pregnant or may be pregnant.  Whether you use tobacco, alcohol, marijuana, or street drugs.  Any history of Armed forces logistics/support/administrative officer.  Any history of depression or anxiety. What are the risks? Generally, this is a safe procedure. However, problems may occur, including:  Allergic reaction to anesthetics.  Lung and heart problems.  Inhaling food or liquids from your stomach into your lungs (aspiration).  Injury to nerves.  Waking up during your procedure and being unable to move (rare).  Extreme agitation or a state of mental confusion (delirium) when you wake up from the  anesthetic.  Air in the bloodstream, which can lead to stroke.  These problems are more likely to develop if you are having a major surgery or if you have an advanced medical condition. You can prevent some of these complications by answering all of your health care provider's questions thoroughly and by following all pre-procedure instructions. General anesthesia can cause side effects, including:  Nausea or vomiting  A sore throat from the breathing tube.  Feeling cold or shivery.  Feeling tired, washed out, or achy.  Sleepiness or drowsiness.  Confusion or agitation.  What happens before the procedure? Staying hydrated Follow instructions from your health care provider about hydration, which may include:  Up to 2 hours before the procedure - you may continue to drink clear liquids, such as water, clear fruit juice, black coffee, and plain tea.  Eating and drinking restrictions Follow instructions from your health care provider about eating and drinking, which may include:  8 hours before the procedure - stop eating heavy meals or foods such as meat, fried foods, or fatty foods.  6 hours before the procedure - stop eating light meals or foods, such as toast or cereal.  6 hours before the procedure - stop drinking milk or drinks that contain milk.  2 hours before the procedure - stop drinking clear liquids.  Medicines  Ask your health care provider about: ? Changing or stopping your regular medicines. This is especially important if you are taking diabetes medicines or blood thinners. ? Taking medicines such as aspirin and ibuprofen. These medicines can thin your blood. Do not take these medicines before your procedure if your health care provider instructs you not to. ? Taking new dietary supplements or medicines. Do not take these during the week before your procedure unless your health care provider approves them.  If you are told to take a medicine or to continue  taking a medicine on the day of the procedure, take the medicine with sips of water. General instructions   Ask if you will be going home the same day, the following day, or after a longer hospital stay. ? Plan to have someone take you home. ? Plan to have someone stay with you for the first 24 hours after you leave the hospital or clinic.  For 3-6 weeks before the procedure, try not to use any tobacco products, such as cigarettes, chewing tobacco, and e-cigarettes.  You may brush your teeth on the morning of the procedure, but make sure to spit out the toothpaste. What happens during the procedure?  You will be given anesthetics through a mask and through an IV tube in one of your veins.  You may receive medicine to help you relax (sedative).  As soon as you are asleep, a breathing tube may be used to help you breathe.  An anesthesia specialist will stay with you throughout the procedure. He or she will help keep you comfortable and safe by continuing to give you medicines and adjusting the amount of medicine that you get. He or she will also watch your blood pressure, pulse, and oxygen levels to make sure that the anesthetics do not cause any problems.  If a breathing tube was used to help you breathe, it will be removed before you wake up. The procedure may vary among health care providers and hospitals. What happens after the procedure?  You will wake up, often slowly, after the procedure is complete, usually in a recovery area.  Your blood pressure, heart rate, breathing rate, and blood oxygen level will be monitored until the medicines you were given have worn off.  You may be given medicine to help you calm down if you feel anxious or agitated.  If you will be going home the same day, your health care provider may check to make sure you can stand, drink, and urinate.  Your health care providers will treat your pain and side effects before you go home.  Do not drive for 24  hours if you received a sedative.  You may: ? Feel nauseous and vomit. ? Have a sore throat. ? Have mental slowness. ? Feel cold or shivery. ? Feel sleepy. ? Feel tired. ? Feel sore or achy, even in parts of your body where you did not have surgery. This information is not intended to replace advice given to you by your health care provider. Make sure you discuss any questions you have with your health care provider. Document Released: 09/30/2007 Document Revised: 12/04/2015 Document Reviewed: 06/07/2015 Elsevier Interactive Patient Education  2018 [place] Anesthesia, Adult, Care After These instructions provide you with information about caring for yourself after your procedure. Your health care provider may also give you more specific instructions. Your treatment has been planned according to current medical practices, but problems sometimes occur. Call your health care provider if you have any problems or questions after your procedure. What can I expect after the procedure? After the procedure, it is common to have:  Vomiting.  A sore throat.  Mental slowness.  It is common to feel:  Nauseous.  Cold or shivery.  Sleepy.  Tired.  Sore or achy, even in parts of your body where you did not have surgery.  Follow these instructions at home: For at least 24 hours after the procedure:  Do not: ? Participate in activities where you could fall or become injured. ? Drive. ?  Use heavy machinery. ? Drink alcohol. ? Take sleeping pills or medicines that cause drowsiness. ? Make important decisions or sign legal documents. ? Take care of children on your own.  Rest. Eating and drinking  If you vomit, drink water, juice, or soup when you can drink without vomiting.  Drink enough fluid to keep your urine clear or pale yellow.  Make sure you have little or no nausea before eating solid foods.  Follow the diet recommended by your health care  provider. General instructions  Have a responsible adult stay with you until you are awake and alert.  Return to your normal activities as told by your health care provider. Ask your health care provider what activities are safe for you.  Take over-the-counter and prescription medicines only as told by your health care provider.  If you smoke, do not smoke without supervision.  Keep all follow-up visits as told by your health care provider. This is important. Contact a health care provider if:  You continue to have nausea or vomiting at home, and medicines are not helpful.  You cannot drink fluids or start eating again.  You cannot urinate after 8-12 hours.  You develop a skin rash.  You have fever.  You have increasing redness at the site of your procedure. Get help right away if:  You have difficulty breathing.  You have chest pain.  You have unexpected bleeding.  You feel that you are having a life-threatening or urgent problem. This information is not intended to replace advice given to you by your health care provider. Make sure you discuss any questions you have with your health care provider. Document Released: 09/29/2000 Document Revised: 11/26/2015 Document Reviewed: 06/07/2015 Elsevier Interactive Patient Education  [employer].

## 2018-01-03 ENCOUNTER — Other Ambulatory Visit: Payer: Self-pay | Admitting: Obstetrics and Gynecology

## 2018-01-06 ENCOUNTER — Other Ambulatory Visit: Payer: Self-pay

## 2018-01-06 ENCOUNTER — Encounter (HOSPITAL_COMMUNITY)
Admission: RE | Admit: 2018-01-06 | Discharge: 2018-01-06 | Disposition: A | Discharge status: Home / Self Care | Payer: 59 | Source: Ambulatory Visit | Attending: Obstetrics and Gynecology | Admitting: Obstetrics and Gynecology

## 2018-01-06 ENCOUNTER — Encounter (HOSPITAL_COMMUNITY): Payer: Self-pay

## 2018-01-06 DIAGNOSIS — R001 Bradycardia, unspecified: Secondary | ICD-10-CM | POA: Diagnosis not present

## 2018-01-06 DIAGNOSIS — Z01810 Encounter for preprocedural cardiovascular examination: Secondary | ICD-10-CM | POA: Diagnosis not present

## 2018-01-06 DIAGNOSIS — Z01812 Encounter for preprocedural laboratory examination: Secondary | ICD-10-CM | POA: Insufficient documentation

## 2018-01-06 HISTORY — DX: Unspecified convulsions: R56.9

## 2018-01-06 LAB — CBC
HEMATOCRIT: 38.7 % (ref 36.0–46.0)
HEMOGLOBIN: 12.7 g/dL (ref 12.0–15.0)
MCH: 29.9 pg (ref 26.0–34.0)
MCHC: 32.8 g/dL (ref 30.0–36.0)
MCV: 91.1 fL (ref 78.0–100.0)
Platelets: 264 10*3/uL (ref 150–400)
RBC: 4.25 MIL/uL (ref 3.87–5.11)
RDW: 13.3 % (ref 11.5–15.5)
WBC: 6.4 10*3/uL (ref 4.0–10.5)

## 2018-01-06 LAB — COMPREHENSIVE METABOLIC PANEL
ALBUMIN: 3.8 g/dL (ref 3.5–5.0)
ALK PHOS: 62 U/L (ref 38–126)
ALT: 12 U/L (ref 0–44)
ANION GAP: 8 (ref 5–15)
AST: 15 U/L (ref 15–41)
BUN: 13 mg/dL (ref 6–20)
CALCIUM: 8.5 mg/dL — AB (ref 8.9–10.3)
CO2: 24 mmol/L (ref 22–32)
Chloride: 104 mmol/L (ref 98–111)
Creatinine, Ser: 0.56 mg/dL (ref 0.44–1.00)
GFR calc Af Amer: >60 mL/min (ref >60)
GFR calc non Af Amer: >60 mL/min (ref >60)
GLUCOSE: 59 mg/dL — AB (ref 70–99)
POTASSIUM: 3.6 mmol/L (ref 3.5–5.1)
SODIUM: 136 mmol/L (ref 135–145)
Total Bilirubin: 0.5 mg/dL (ref 0.3–1.2)
Total Protein: 7 g/dL (ref 6.5–8.1)

## 2018-01-06 LAB — HCG, SERUM, QUALITATIVE: Preg, Serum: NEGATIVE

## 2018-01-12 ENCOUNTER — Ambulatory Visit (HOSPITAL_COMMUNITY)
Admission: RE | Admit: 2018-01-12 | Discharge: 2018-01-12 | Disposition: A | Discharge status: Home / Self Care | Payer: 59 | Source: Ambulatory Visit | Attending: Obstetrics and Gynecology | Admitting: Obstetrics and Gynecology

## 2018-01-12 ENCOUNTER — Encounter (HOSPITAL_COMMUNITY): Payer: Self-pay

## 2018-01-12 ENCOUNTER — Encounter (HOSPITAL_COMMUNITY)
Admission: RE | Disposition: A | Discharge status: Home / Self Care | Payer: Self-pay | Source: Ambulatory Visit | Attending: Obstetrics and Gynecology

## 2018-01-12 ENCOUNTER — Ambulatory Visit (HOSPITAL_COMMUNITY): Payer: 59 | Admitting: Anesthesiology

## 2018-01-12 DIAGNOSIS — N871 Moderate cervical dysplasia: Secondary | ICD-10-CM

## 2018-01-12 DIAGNOSIS — I10 Essential (primary) hypertension: Secondary | ICD-10-CM | POA: Diagnosis not present

## 2018-01-12 DIAGNOSIS — K900 Celiac disease: Secondary | ICD-10-CM | POA: Diagnosis not present

## 2018-01-12 DIAGNOSIS — Z87891 Personal history of nicotine dependence: Secondary | ICD-10-CM | POA: Insufficient documentation

## 2018-01-12 DIAGNOSIS — F329 Major depressive disorder, single episode, unspecified: Secondary | ICD-10-CM | POA: Insufficient documentation

## 2018-01-12 DIAGNOSIS — Z79899 Other long term (current) drug therapy: Secondary | ICD-10-CM | POA: Diagnosis not present

## 2018-01-12 DIAGNOSIS — E039 Hypothyroidism, unspecified: Secondary | ICD-10-CM | POA: Insufficient documentation

## 2018-01-12 DIAGNOSIS — D069 Carcinoma in situ of cervix, unspecified: Secondary | ICD-10-CM | POA: Diagnosis not present

## 2018-01-12 HISTORY — PX: CERVICAL CONIZATION W/BX: SHX1330

## 2018-01-12 SURGERY — CONE BIOPSY, CERVIX
Anesthesia: General | Site: Cervix

## 2018-01-12 MED ORDER — HYDROMORPHONE HCL 1 MG/ML IJ SOLN: 0.2500 mg | INTRAMUSCULAR | Status: DC | PRN

## 2018-01-12 MED ORDER — KETOROLAC TROMETHAMINE 10 MG PO TABS: 10.0000 mg | ORAL_TABLET | Freq: Four times a day (QID) | ORAL | 0 refills | Status: DC | PRN

## 2018-01-12 MED ORDER — GLYCOPYRROLATE 0.2 MG/ML IJ SOLN
INTRAMUSCULAR | Status: DC | PRN
  Administered 2018-01-12: 0.2 mg via INTRAVENOUS

## 2018-01-12 MED ORDER — IODINE STRONG (LUGOLS) 5 % PO SOLN
ORAL | Status: AC
  Filled 2018-01-12: qty 1

## 2018-01-12 MED ORDER — DIPHENHYDRAMINE HCL 50 MG/ML IJ SOLN: INTRAMUSCULAR | Status: DC | PRN

## 2018-01-12 MED ORDER — BUPIVACAINE-EPINEPHRINE 0.5% -1:200000 IJ SOLN
INTRAMUSCULAR | Status: DC | PRN
  Administered 2018-01-12: 9 mL

## 2018-01-12 MED ORDER — MIDAZOLAM HCL 2 MG/2ML IJ SOLN
INTRAMUSCULAR | Status: AC
  Filled 2018-01-12: qty 2

## 2018-01-12 MED ORDER — FERRIC SUBSULFATE 259 MG/GM EX SOLN
CUTANEOUS | Status: DC | PRN
  Administered 2018-01-12: 1

## 2018-01-12 MED ORDER — ONDANSETRON HCL 4 MG/2ML IJ SOLN
INTRAMUSCULAR | Status: AC
  Filled 2018-01-12: qty 2

## 2018-01-12 MED ORDER — METRONIDAZOLE 0.75 % VA GEL: 1.0000 | Freq: Every day | VAGINAL | 1 refills | Status: AC

## 2018-01-12 MED ORDER — LACTATED RINGERS IV SOLN
INTRAVENOUS | Status: DC
  Administered 2018-01-12: 07:00:00 via INTRAVENOUS

## 2018-01-12 MED ORDER — PHENYLEPHRINE 40 MCG/ML (10ML) SYRINGE FOR IV PUSH (FOR BLOOD PRESSURE SUPPORT)
PREFILLED_SYRINGE | INTRAVENOUS | Status: AC
  Filled 2018-01-12: qty 10

## 2018-01-12 MED ORDER — LIDOCAINE HCL (PF) 1 % IJ SOLN
INTRAMUSCULAR | Status: AC
  Filled 2018-01-12: qty 5

## 2018-01-12 MED ORDER — MEPERIDINE HCL 50 MG/ML IJ SOLN: 6.2500 mg | INTRAMUSCULAR | Status: DC | PRN

## 2018-01-12 MED ORDER — LACTATED RINGERS IV SOLN
INTRAVENOUS | Status: DC | PRN
  Administered 2018-01-12: 07:00:00 via INTRAVENOUS

## 2018-01-12 MED ORDER — FENTANYL CITRATE (PF) 100 MCG/2ML IJ SOLN
INTRAMUSCULAR | Status: AC
  Filled 2018-01-12: qty 2

## 2018-01-12 MED ORDER — DEXAMETHASONE SODIUM PHOSPHATE 4 MG/ML IJ SOLN
INTRAMUSCULAR | Status: DC | PRN
  Administered 2018-01-12: 4 mg via INTRAVENOUS

## 2018-01-12 MED ORDER — LACTATED RINGERS IV SOLN: INTRAVENOUS | Status: DC

## 2018-01-12 MED ORDER — PROPOFOL 10 MG/ML IV BOLUS
INTRAVENOUS | Status: DC | PRN
  Administered 2018-01-12: 20 mg via INTRAVENOUS
  Administered 2018-01-12: 180 mg via INTRAVENOUS

## 2018-01-12 MED ORDER — HYDROCODONE-ACETAMINOPHEN 5-325 MG PO TABS: 1.0000 | ORAL_TABLET | Freq: Four times a day (QID) | ORAL | 0 refills | Status: DC | PRN

## 2018-01-12 MED ORDER — MIDAZOLAM HCL 2 MG/2ML IJ SOLN
INTRAMUSCULAR | Status: DC | PRN
  Administered 2018-01-12: 1 mg via INTRAVENOUS

## 2018-01-12 MED ORDER — DIPHENHYDRAMINE HCL 50 MG/ML IJ SOLN
INTRAMUSCULAR | Status: AC
  Filled 2018-01-12: qty 1

## 2018-01-12 MED ORDER — SODIUM CHLORIDE 0.9 % IR SOLN
Status: DC | PRN
  Administered 2018-01-12: 1000 mL

## 2018-01-12 MED ORDER — DIPHENHYDRAMINE HCL 50 MG/ML IJ SOLN
INTRAMUSCULAR | Status: DC | PRN
  Administered 2018-01-12: 25 mg via INTRAVENOUS

## 2018-01-12 MED ORDER — PROMETHAZINE HCL 25 MG/ML IJ SOLN: 6.2500 mg | INTRAMUSCULAR | Status: DC | PRN

## 2018-01-12 MED ORDER — HYDROCODONE-ACETAMINOPHEN 7.5-325 MG PO TABS: 1.0000 | ORAL_TABLET | Freq: Once | ORAL | Status: DC | PRN

## 2018-01-12 MED ORDER — DEXAMETHASONE SODIUM PHOSPHATE 4 MG/ML IJ SOLN
INTRAMUSCULAR | Status: AC
  Filled 2018-01-12: qty 1

## 2018-01-12 MED ORDER — BUPIVACAINE-EPINEPHRINE (PF) 0.5% -1:200000 IJ SOLN
INTRAMUSCULAR | Status: AC
  Filled 2018-01-12: qty 30

## 2018-01-12 MED ORDER — FENTANYL CITRATE (PF) 100 MCG/2ML IJ SOLN
INTRAMUSCULAR | Status: DC | PRN
  Administered 2018-01-12: 50 ug via INTRAVENOUS

## 2018-01-12 MED ORDER — GLYCOPYRROLATE 0.2 MG/ML IJ SOLN
INTRAMUSCULAR | Status: AC
  Filled 2018-01-12: qty 2

## 2018-01-12 MED ORDER — PROPOFOL 10 MG/ML IV BOLUS
INTRAVENOUS | Status: AC
  Filled 2018-01-12: qty 20

## 2018-01-12 MED ORDER — ONDANSETRON HCL 4 MG/2ML IJ SOLN
INTRAMUSCULAR | Status: DC | PRN
  Administered 2018-01-12: 4 mg via INTRAVENOUS

## 2018-01-12 SURGICAL SUPPLY — 26 items
BLADE SURG 15 STRL LF DISP TIS (BLADE) ×1 IMPLANT
BLADE SURG 15 STRL SS (BLADE) ×2
CLOTH BEACON ORANGE TIMEOUT ST (SAFETY) ×3 IMPLANT
COVER LIGHT HANDLE STERIS (MISCELLANEOUS) ×6 IMPLANT
DECANTER SPIKE VIAL GLASS SM (MISCELLANEOUS) ×3 IMPLANT
DRAPE HALF SHEET 40X57 (DRAPES) ×3 IMPLANT
ELECT REM PT RETURN 9FT ADLT (ELECTROSURGICAL) ×3
ELECTRODE REM PT RTRN 9FT ADLT (ELECTROSURGICAL) ×1 IMPLANT
GAUZE SPONGE 4X4 12PLY STRL (GAUZE/BANDAGES/DRESSINGS) ×3 IMPLANT
GLOVE BIOGEL PI IND STRL 7.0 (GLOVE) ×2 IMPLANT
GLOVE BIOGEL PI IND STRL 9 (GLOVE) ×1 IMPLANT
GLOVE BIOGEL PI INDICATOR 7.0 (GLOVE) ×4
GLOVE BIOGEL PI INDICATOR 9 (GLOVE) ×2
GLOVE ECLIPSE 6.5 STRL STRAW (GLOVE) ×3 IMPLANT
GLOVE ECLIPSE 9.0 STRL (GLOVE) ×3 IMPLANT
GOWN SPEC L3 XXLG W/TWL (GOWN DISPOSABLE) ×3 IMPLANT
GOWN STRL REUS W/TWL LRG LVL3 (GOWN DISPOSABLE) ×3 IMPLANT
KIT TURNOVER CYSTO (KITS) ×3 IMPLANT
MANIFOLD NEPTUNE II (INSTRUMENTS) ×3 IMPLANT
PACK PERI GYN (CUSTOM PROCEDURE TRAY) ×3 IMPLANT
PAD ARMBOARD 7.5X6 YLW CONV (MISCELLANEOUS) ×3 IMPLANT
SCOPETTES 8  STERILE (MISCELLANEOUS) ×2
SCOPETTES 8 STERILE (MISCELLANEOUS) ×1 IMPLANT
SET BASIN LINEN APH (SET/KITS/TRAYS/PACK) ×3 IMPLANT
SUT CHROMIC 2 0 CT 1 (SUTURE) ×9 IMPLANT
SYR CONTROL 10ML LL (SYRINGE) ×3 IMPLANT

## 2018-01-12 NOTE — Op Note (Signed)
Please see the brief operative note for surgical details

## 2018-01-12 NOTE — Anesthesia Procedure Notes (Signed)
Procedure Name: LMA Insertion Date/Time: 01/12/2018 7:37 AM Performed by: Adalberto Ill, CRNA Pre-anesthesia Checklist: Patient identified, Emergency Drugs available, Suction available, Patient being monitored and Timeout performed Patient Re-evaluated:Patient Re-evaluated prior to induction Oxygen Delivery Method: Circle system utilized Preoxygenation: Pre-oxygenation with 100% oxygen Induction Type: IV induction LMA: LMA inserted LMA Size: 4.0 Number of attempts: 1 (brief atraumatic) Placement Confirmation: ETT inserted through vocal cords under direct vision,  positive ETCO2 and breath sounds checked- equal and bilateral ETT to lip (cm): yes tape. Tube secured with: Tape Dental Injury: Teeth and Oropharynx as per pre-operative assessment

## 2018-01-12 NOTE — Op Note (Signed)
01/12/2018  8:36 AM  PATIENT:  Renee Norman  27 y.o. 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   Delay start of Pharmacological VTE agent (>24hrs) due to surgical blood loss or risk of bleeding: not applicable Details of procedure: Patient was taken the operating room prepped and draped for vaginal procedure with timeout conducted.  Cervix was visualized through a one-sided Graves speculum, with some uterine descensus noted.  The cervix was rather large and bulbous.  Paracervical and intracervical block was applied x9 cc of Marcaine with epinephrine, then a 1.5 cm wide specimen was begun by circumscribing the exocervix with a #15 blade, and dissecting approximately 1/2 cm of the endocervical canal and a cone shaped fashion, and circling the specimen and then finally using curved Jorgenson scissors to amputate the uppermost edge of the conization specimen in a smooth fashion.  Sturmdorf stitches were placed at 10:00 2:00 and 6:00 approximate the edges of the conization specimen to some degree, point cautery was used on a couple spots on the posterior lip of the cervix and then Monsel solution applied with good hemostasis achieved.  Patient will go to  recovery room will receive Toradol IV in recovery and go home on Toradol, as oral, a small number of Vicodin and MetroGel Intravaginally every couple of days

## 2018-01-12 NOTE — Anesthesia Preprocedure Evaluation (Signed)
Anesthesia Evaluation  Patient identified by MRN, date of birth, ID band Patient awake    Reviewed: Allergy & Precautions, H&P , NPO status , Patient's Chart, lab work & pertinent test results, reviewed documented beta blocker date and time   Airway Mallampati: II  TM Distance: >3 FB Neck ROM: full    Dental no notable dental hx. (+) Poor Dentition, Chipped   Pulmonary neg pulmonary ROS, Current Smoker,    Pulmonary exam normal breath sounds clear to auscultation       Cardiovascular Exercise Tolerance: Good hypertension, On Medications negative cardio ROS   Rhythm:regular Rate:Normal     Neuro/Psych Seizures -,  negative neurological ROS  negative psych ROS   GI/Hepatic negative GI ROS, Neg liver ROS,   Endo/Other  negative endocrine ROSHypothyroidism Morbid obesity  Renal/GU negative Renal ROS  negative genitourinary   Musculoskeletal   Abdominal   Peds  Hematology negative hematology ROS (+)   Anesthesia Other Findings Inhaler d/c'd after course of rhinovirus Dental carries Tobacco abuse  obesity  Reproductive/Obstetrics negative OB ROS                             Anesthesia Physical Anesthesia Plan  ASA: II  Anesthesia Plan: General LMA   Post-op Pain Management:    Induction:   PONV Risk Score and Plan:   Airway Management Planned:   Additional Equipment:   Intra-op Plan:   Post-operative Plan:   Informed Consent: I have reviewed the patients History and Physical, chart, labs and discussed the procedure including the risks, benefits and alternatives for the proposed anesthesia with the patient or authorized representative who has indicated his/her understanding and acceptance.   Dental Advisory Given  Plan Discussed with: CRNA and Anesthesiologist  Anesthesia Plan Comments:         Anesthesia Quick Evaluation

## 2018-01-12 NOTE — Anesthesia Postprocedure Evaluation (Signed)
Anesthesia Post Note  Patient: Renee Norman  Procedure(s) Performed: COLD KNIFE CONIZATION OF CERVIX (N/A Cervix)  Patient location during evaluation: PACU Anesthesia Type: General Level of consciousness: awake and alert and patient cooperative Pain management: satisfactory to patient Vital Signs Assessment: post-procedure vital signs reviewed and stable Respiratory status: spontaneous breathing Cardiovascular status: stable Postop Assessment: no apparent nausea or vomiting Anesthetic complications: no     Last Vitals:  Vitals:   01/12/18 0915 01/12/18 0923  BP: 104/60 (!) 102/59  Pulse: (!) 51   Resp: 14 18  Temp:  36.6 C  SpO2: 97% 100%    Last Pain:  Vitals:   01/12/18 0923  TempSrc: Oral  PainSc:                  Drucie Opitz

## 2018-01-12 NOTE — H&P (Signed)
Renee Norman is an 27 y.o. female. She is admitted for a cold knife conization due to CIN ii-III on endocervical sample at recent Colposcopy for HSIL . Pt was referred to our office by [facility].   Pertinent Gynecological History: Menses: Patient's last menstrual period was 12/27/2017 (exact date).  Bleeding:  Contraception: abstinent from female partners DES exposure: unknown Blood transfusions: none Sexually transmitted diseases: no past history Previous GYN Procedures: colposcopy  Last mammogram:     Date:    Last pap: abnormal: [facility] Date: 3 2019 OB History: G0, P0   Menstrual History: Menarche age:  Patient's last menstrual period was 12/27/2017 (exact date).    Past Medical History:  Diagnosis Date  . Abnormal Pap smear of cervix 10/29/2017   HSIL  . Celiac disease   . Depression   . Hypertension   . Hypothyroid   . Seizures ([place])    had febrile seizures as child, none since and on meds for this.    Past Surgical History:  Procedure Laterality Date  . REPAIR EXTENSOR TENDON HAND Right   . TONSILLECTOMY      Family History  Problem Relation Age of Onset  . Hypertension Father   . Diabetes Mother   . Hypertension Mother     Social History:  reports that she has been smoking cigarettes.  She has a 5.00 pack-year smoking history. She has never used smokeless tobacco. She reports that she drinks alcohol. She reports that she has current or past drug history. Drug: Marijuana.  Allergies:  Allergies  Allergen Reactions  . Gluten Meal Other (See Comments)    Unknown  . Peanut-Containing Drug Products Other (See Comments)    Pecans    Medications Prior to Admission  Medication Sig Dispense Refill Last Dose  . acetaminophen (TYLENOL) 325 MG tablet Take 650 mg by mouth every 6 (six) hours as needed for moderate pain or headache.     . Cholecalciferol (VITAMIN D3) 2000 units TABS Take 2,000 Units by mouth daily.   01/11/2018 at  Unknown time  . escitalopram (LEXAPRO) 20 MG tablet Take 20 mg by mouth at bedtime.   01/11/2018 at Unknown time  . ibuprofen (ADVIL,MOTRIN) 200 MG tablet Take 400 mg by mouth every 6 (six) hours as needed for headache or moderate pain.   Past Week at Unknown time  . lisinopril-hydrochlorothiazide (PRINZIDE,ZESTORETIC) 10-12.5 MG tablet Take 1 tablet by mouth daily.  3 01/12/2018 at Unknown time  . albuterol (PROVENTIL HFA;VENTOLIN HFA) 108 (90 Base) MCG/ACT inhaler Inhale 2 puffs into the lungs every 6 (six) hours as needed for wheezing or shortness of breath. 1 Inhaler 0 More than a month at Unknown time  . benzonatate (TESSALON PERLES) 100 MG capsule Take 1 capsule (100 mg total) by mouth 3 (three) times daily as needed for cough. (Patient not taking: Reported on 01/13/2017) 15 capsule 0 Completed Course at Unknown time  . cyclobenzaprine (FLEXERIL) 5 MG tablet Take 1 tablet (5 mg total) by mouth 3 (three) times daily as needed for muscle spasms. (Patient not taking: Reported on 12/21/2017) 30 tablet 0 Not Taking at Unknown time  . fexofenadine-pseudoephedrine (ALLEGRA-D) 60-120 MG 12 hr tablet Take 1 tablet by mouth 2 (two) times daily. (Patient not taking: Reported on 01/13/2017) 20 tablet 0 Completed Course at Unknown time  . fluticasone (FLONASE) 50 MCG/ACT nasal spray Place 2 sprays into both nostrils daily. (Patient not taking: Reported on 12/21/2017) 16 g 0 Not  Taking at Unknown time  . lidocaine (XYLOCAINE) 2 % solution Use as directed 10 mLs in the mouth or throat as needed for mouth pain. (Patient not taking: Reported on 12/03/2017) 100 mL 0 Completed Course at Unknown time    ROS  Blood pressure (!) 97/52, pulse (!) 54, temperature 98.3 F (36.8 C), temperature source Oral, resp. rate 16, height 5' 3"  (1.6 m), weight 197 lb (89.4 kg), last menstrual period 12/27/2017, SpO2 97 %. Physical Exam  Constitutional: She is oriented to person, place, and time. She appears well-developed and  well-nourished. No distress.  HENT:  Head: Normocephalic.  Eyes: Pupils are equal, round, and reactive to light.  Neck: Normal range of motion.  Cardiovascular: Normal rate.  Respiratory: Effort normal.  GI: Soft.  Genitourinary: Vagina normal.  Neurological: She is alert and oriented to person, place, and time. She has normal reflexes.  Skin: Skin is warm and dry.  Psychiatric: She has a normal mood and affect.    CBC    Component Value Date/Time   WBC 6.4 01/06/2018 0925   RBC 4.25 01/06/2018 0925   HGB 12.7 01/06/2018 0925   HGB 12.9 11/12/2013 1750   HCT 38.7 01/06/2018 0925   HCT 38.8 11/12/2013 1750   PLT 264 01/06/2018 0925   PLT 295 11/12/2013 1750   MCV 91.1 01/06/2018 0925   MCV 84 11/12/2013 1750   MCH 29.9 01/06/2018 0925   MCHC 32.8 01/06/2018 0925   RDW 13.3 01/06/2018 0925   RDW 14.6 (H) 11/12/2013 1750   LYMPHSABS 3.5 12/15/2014 2129   LYMPHSABS 4.3 (H) 11/12/2013 1750   MONOABS 0.5 12/15/2014 2129   MONOABS 0.8 11/12/2013 1750   EOSABS 0.2 12/15/2014 2129   EOSABS 0.2 11/12/2013 1750   BASOSABS 0.0 12/15/2014 2129   BASOSABS 0.1 11/12/2013 1750   PT Pathology: Cin II-III, in endocervical sample ,ECC,so will need Excisional procedure for Endocervical lesion HCG negative.   Assessment/Plan: Cin II- III in endocervical sample  Plan [facility]  Procedure reviewed.    Jonnie Kind 01/12/2018, 7:09 AM

## 2018-01-12 NOTE — Progress Notes (Signed)
  January 12, 2018  Patient: Renee Norman  Date of Birth: [birth year]  Date of Visit: 01/12/2018    To Whom It May Concern:  [name] was seen and treated in our surgical department on 01/12/2018. [name]  can return to work on Monday, July 15  Sincerely,  Surgical Staff

## 2018-01-12 NOTE — Discharge Instructions (Signed)
Cervical Conization, Care After This sheet gives you information about how to care for yourself after your procedure. Your doctor may also give you more specific instructions. If you have problems or questions, contact your doctor. Follow these instructions at home: Medicines  Take over-the-counter and prescription medicines only as told by your doctor.  Do not take aspirin until your doctor says it is okay.  If you take pain medicine: ? You may have constipation. To help treat this, your doctor may tell you to:  Drink enough fluid to keep your pee (urine) clear or pale yellow.  Take medicines.  Eat foods that are high in fiber. These include fresh fruits and vegetables, whole grains, bran, and beans.  Limit foods that are high in fat and sugar. These include fried foods and sweet foods. ? Do not drive or use heavy machines. General instructions  You can eat your usual diet unless your doctor tells you not to do so.  Take showers for the first week. Do not take baths, swim, or use hot tubs until your doctor says it is okay.  Do not douche, use tampons, or have sex until your doctor says it is okay.  For 7-14 days after your procedure, avoid: ? Being very active. ? Exercising. ? Heavy lifting.  Keep all follow-up visits as told by your doctor. This is important. Contact a doctor if:  You have a rash.  You are dizzy or lightheaded.  You feel sick to your stomach (nauseous).  You throw up (vomit).  You have fluid from your vagina (vaginal discharge) that smells bad. Get help right away if:  There are blood clots coming from your vagina.  You have more bleeding than you would have in a normal period. For example, you soak a pad in less than 1 hour.  You have a fever.  You have more and more cramps.  You pass out (faint).  You have pain when peeing.  Your have a lot of pain.  Your pain gets worse.  Your pain does not get better when you take your  medicine.  You have blood in your pee.  You throw up (vomit). Summary  After your procedure, take over-the-counter and prescription medicines only as told by your doctor.  Do not douche, use tampons, or have sex until your doctor says it is okay.  For about 7-14 days after your procedure, try not to exercise or lift heavy objects.  Get help right away if you have new symptoms, or if your symptoms become worse. This information is not intended to replace advice given to you by your health care provider. Make sure you discuss any questions you have with your health care provider. Document Released: 04/01/2008 Document Revised: 06/25/2016 Document Reviewed: 06/25/2016 Elsevier Interactive Patient Education  2017 [employer].

## 2018-01-12 NOTE — Transfer of Care (Signed)
Immediate Anesthesia Transfer of Care Note  Patient: Renee Norman  Procedure(s) Performed: COLD KNIFE CONIZATION OF CERVIX (N/A Cervix)  Patient Location: PACU  Anesthesia Type:General  Level of Consciousness: awake, alert , oriented and patient cooperative  Airway & Oxygen Therapy: Patient Spontanous Breathing and Patient connected to face mask oxygen  Post-op Assessment: Report given to RN and Post -op Vital signs reviewed and stable  Post vital signs: Reviewed and stable  Last Vitals:  Vitals Value Taken Time  BP    Temp    Pulse 64 01/12/2018  8:36 AM  Resp 16 01/12/2018  8:36 AM  SpO2 100 % 01/12/2018  8:36 AM  Vitals shown include unvalidated device data.  Last Pain:  Vitals:   01/12/18 0639  TempSrc: Oral  PainSc: 0-No pain      Patients Stated Pain Goal: 7 (12/37/99 0940)  Complications: No apparent anesthesia complications

## 2018-01-13 ENCOUNTER — Encounter (HOSPITAL_COMMUNITY): Payer: Self-pay | Admitting: Obstetrics and Gynecology

## 2018-01-20 ENCOUNTER — Other Ambulatory Visit: Payer: Self-pay

## 2018-01-20 ENCOUNTER — Encounter: Payer: Self-pay | Admitting: Obstetrics and Gynecology

## 2018-01-20 ENCOUNTER — Ambulatory Visit: Payer: 59 | Admitting: Obstetrics and Gynecology

## 2018-01-20 VITALS — BP 108/67 | HR 60 | Ht 64.0 in | Wt 194.8 lb

## 2018-01-20 DIAGNOSIS — Z09 Encounter for follow-up examination after completed treatment for conditions other than malignant neoplasm: Secondary | ICD-10-CM

## 2018-01-20 NOTE — Progress Notes (Signed)
Patient ID: Renee Norman, female   DOB: 06-16-1991, 27 y.o.   MRN: [id number]     Subjective:  Renee Norman is a 27 y.o. female now 1 weeks status post cold knife conization of cervix.  PATHOLOGY: CIN-2-3 with clear margins She has noticed discharge but expected that to be normal but otherwise has been doing well postoperatively. Was given Metrogel after surgery and has been using it regularly. Review of Systems Negative except    Diet:   normal   Bowel movements : normal.  The patient is not having any pain.  Objective:  LMP 12/27/2017 (Exact Date)  General:Well developed, well nourished.  No acute distress. Abdomen: Bowel sounds normal, soft, non-tender. Pelvic Exam:    External Genitalia:  Normal.    Vagina: Normal    Cervix: Normal, well healed conization, with dissolving stitches    Uterus: Normal  Incision(s):   Healing well, no drainage, no erythema, no hernia, no swelling, no dehiscence,     Assessment:  Post-Op 1 weeks s/p cold knife conization of cervix   Doing well postoperatively.   Plan:  1.Wound care discussed  of metrogel 3. Activity restrictions: no sexual activity 4. return to work: not applicable. 5. Follow up in 3 weeks post-op  By signing my name below, I, Samul Dada, attest that this documentation has been prepared under the direction and in the presence of Jonnie Kind, MD. Electronically Signed: [place]. 01/20/18. 10:18 AM.  I personally performed the services described in this documentation, which was SCRIBED in my presence. The recorded information has been reviewed and considered accurate. It has been edited as necessary during review. Jonnie Kind, MD

## 2018-01-22 ENCOUNTER — Encounter: Payer: Self-pay | Admitting: Obstetrics and Gynecology

## 2018-01-22 ENCOUNTER — Telehealth: Payer: Self-pay | Admitting: *Deleted

## 2018-01-22 NOTE — Telephone Encounter (Signed)
Patient states she was supposed to return to work today since having a cold knife conization on 7/9. Had completely stopped bleeding but has now started having some bleeding again along with slight abdominal swelling. Patient states she works 8-11 hour shifts on her feet and is concerned since she stopped bleeding and is now bleeding again. Is supposed to work this weekend but would like to be out until Monday.  Please advise.

## 2018-01-22 NOTE — Telephone Encounter (Signed)
Unable to reach by phone, does not have voicemail set up

## 2018-01-27 NOTE — Telephone Encounter (Signed)
Given an additional week out of work

## 2018-02-10 ENCOUNTER — Ambulatory Visit: Payer: 59 | Admitting: Obstetrics and Gynecology

## 2018-02-16 ENCOUNTER — Ambulatory Visit: Payer: 59 | Admitting: Obstetrics and Gynecology

## 2018-02-22 ENCOUNTER — Ambulatory Visit: Payer: 59 | Admitting: Obstetrics and Gynecology

## 2018-03-10 ENCOUNTER — Ambulatory Visit: Payer: 59 | Admitting: Obstetrics and Gynecology

## 2018-03-22 ENCOUNTER — Encounter: Payer: Self-pay | Admitting: *Deleted

## 2018-03-22 ENCOUNTER — Ambulatory Visit: Payer: 59 | Admitting: Obstetrics and Gynecology

## 2018-04-09 ENCOUNTER — Other Ambulatory Visit: Payer: Self-pay

## 2018-04-09 ENCOUNTER — Emergency Department: Payer: Self-pay

## 2018-04-09 ENCOUNTER — Emergency Department
Admission: EM | Admit: 2018-04-09 | Discharge: 2018-04-09 | Disposition: A | Discharge status: Home / Self Care | Payer: 59 | Attending: Emergency Medicine | Admitting: Emergency Medicine

## 2018-04-09 DIAGNOSIS — B9689 Other specified bacterial agents as the cause of diseases classified elsewhere: Secondary | ICD-10-CM | POA: Insufficient documentation

## 2018-04-09 DIAGNOSIS — R1031 Right lower quadrant pain: Secondary | ICD-10-CM | POA: Insufficient documentation

## 2018-04-09 DIAGNOSIS — F17210 Nicotine dependence, cigarettes, uncomplicated: Secondary | ICD-10-CM | POA: Insufficient documentation

## 2018-04-09 DIAGNOSIS — R112 Nausea with vomiting, unspecified: Secondary | ICD-10-CM | POA: Insufficient documentation

## 2018-04-09 DIAGNOSIS — I10 Essential (primary) hypertension: Secondary | ICD-10-CM | POA: Insufficient documentation

## 2018-04-09 DIAGNOSIS — K5900 Constipation, unspecified: Secondary | ICD-10-CM

## 2018-04-09 DIAGNOSIS — Z91010 Allergy to peanuts: Secondary | ICD-10-CM | POA: Insufficient documentation

## 2018-04-09 DIAGNOSIS — E039 Hypothyroidism, unspecified: Secondary | ICD-10-CM | POA: Insufficient documentation

## 2018-04-09 DIAGNOSIS — Z79899 Other long term (current) drug therapy: Secondary | ICD-10-CM | POA: Insufficient documentation

## 2018-04-09 DIAGNOSIS — N760 Acute vaginitis: Secondary | ICD-10-CM | POA: Insufficient documentation

## 2018-04-09 LAB — COMPREHENSIVE METABOLIC PANEL
ALK PHOS: 62 U/L (ref 38–126)
ALT: 15 U/L (ref 0–44)
AST: 28 U/L (ref 15–41)
Albumin: 3.9 g/dL (ref 3.5–5.0)
Anion gap: 11 (ref 5–15)
BUN: 10 mg/dL (ref 6–20)
CHLORIDE: 107 mmol/L (ref 98–111)
CO2: 21 mmol/L — AB (ref 22–32)
Calcium: 8.7 mg/dL — ABNORMAL LOW (ref 8.9–10.3)
Creatinine, Ser: 0.76 mg/dL (ref 0.44–1.00)
GFR calc Af Amer: >60 mL/min (ref >60)
GFR calc non Af Amer: >60 mL/min (ref >60)
GLUCOSE: 164 mg/dL — AB (ref 70–99)
POTASSIUM: 3.8 mmol/L (ref 3.5–5.1)
Sodium: 139 mmol/L (ref 135–145)
Total Bilirubin: 0.6 mg/dL (ref 0.3–1.2)
Total Protein: 7.4 g/dL (ref 6.5–8.1)

## 2018-04-09 LAB — WET PREP, GENITAL
SPERM: NONE SEEN
TRICH WET PREP: NONE SEEN
Yeast Wet Prep HPF POC: NONE SEEN

## 2018-04-09 LAB — CBC
HEMATOCRIT: 39.1 % (ref 35.0–47.0)
HEMOGLOBIN: 13.9 g/dL (ref 12.0–16.0)
MCH: 32.2 pg (ref 26.0–34.0)
MCHC: 35.4 g/dL (ref 32.0–36.0)
MCV: 90.8 fL (ref 80.0–100.0)
Platelets: 240 10*3/uL (ref 150–440)
RBC: 4.31 MIL/uL (ref 3.80–5.20)
RDW: 13.9 % (ref 11.5–14.5)
WBC: 6.1 10*3/uL (ref 3.6–11.0)

## 2018-04-09 LAB — URINALYSIS, COMPLETE (UACMP) WITH MICROSCOPIC
BACTERIA UA: NONE SEEN
BILIRUBIN URINE: NEGATIVE
Glucose, UA: NEGATIVE mg/dL
KETONES UR: NEGATIVE mg/dL
Nitrite: NEGATIVE
PH: 7 (ref 5.0–8.0)
PROTEIN: NEGATIVE mg/dL
Specific Gravity, Urine: 1.011 (ref 1.005–1.030)

## 2018-04-09 LAB — LIPASE, BLOOD: Lipase: 25 U/L (ref 11–51)

## 2018-04-09 LAB — CHLAMYDIA/NGC RT PCR (ARMC ONLY)
CHLAMYDIA TR: NOT DETECTED
N GONORRHOEAE: NOT DETECTED

## 2018-04-09 LAB — POCT PREGNANCY, URINE: PREG TEST UR: NEGATIVE

## 2018-04-09 MED ORDER — HYDROCODONE-ACETAMINOPHEN 5-325 MG PO TABS
1.0000 | ORAL_TABLET | Freq: Once | ORAL | Status: AC
  Administered 2018-04-09: 1 via ORAL

## 2018-04-09 MED ORDER — MORPHINE SULFATE (PF) 4 MG/ML IV SOLN
4.0000 mg | Freq: Once | INTRAVENOUS | Status: AC
  Administered 2018-04-09: 4 mg via INTRAVENOUS
  Filled 2018-04-09: qty 1

## 2018-04-09 MED ORDER — HYDROMORPHONE HCL 1 MG/ML IJ SOLN
INTRAMUSCULAR | Status: AC
  Administered 2018-04-09: 1 mg via INTRAVENOUS
  Filled 2018-04-09: qty 1

## 2018-04-09 MED ORDER — IOPAMIDOL (ISOVUE-300) INJECTION 61%
100.0000 mL | Freq: Once | INTRAVENOUS | Status: AC | PRN
  Administered 2018-04-09: 100 mL via INTRAVENOUS

## 2018-04-09 MED ORDER — HYDROCODONE-ACETAMINOPHEN 5-325 MG PO TABS: 1.0000 | ORAL_TABLET | Freq: Four times a day (QID) | ORAL | 0 refills | Status: AC | PRN

## 2018-04-09 MED ORDER — MORPHINE SULFATE (PF) 4 MG/ML IV SOLN
4.0000 mg | Freq: Once | INTRAVENOUS | Status: AC | PRN
  Administered 2018-04-09: 4 mg via INTRAVENOUS
  Filled 2018-04-09: qty 1

## 2018-04-09 MED ORDER — HYDROMORPHONE HCL 1 MG/ML IJ SOLN
1.0000 mg | Freq: Once | INTRAMUSCULAR | Status: AC
  Administered 2018-04-09: 1 mg via INTRAVENOUS
  Filled 2018-04-09: qty 1

## 2018-04-09 MED ORDER — HYDROCODONE-ACETAMINOPHEN 5-325 MG PO TABS
1.0000 | ORAL_TABLET | Freq: Once | ORAL | Status: DC
  Filled 2018-04-09: qty 1

## 2018-04-09 MED ORDER — ONDANSETRON HCL 4 MG/2ML IJ SOLN
4.0000 mg | Freq: Once | INTRAMUSCULAR | Status: AC | PRN
  Administered 2018-04-09: 4 mg via INTRAVENOUS
  Filled 2018-04-09: qty 2

## 2018-04-09 MED ORDER — PIPERACILLIN-TAZOBACTAM 3.375 G IVPB 30 MIN
3.3750 g | Freq: Once | INTRAVENOUS | Status: AC
  Administered 2018-04-09: 3.375 g via INTRAVENOUS
  Filled 2018-04-09: qty 50

## 2018-04-09 MED ORDER — METRONIDAZOLE 500 MG PO TABS: 500.0000 mg | ORAL_TABLET | Freq: Two times a day (BID) | ORAL | 0 refills | Status: AC

## 2018-04-09 MED ORDER — SODIUM CHLORIDE 0.9 % IV BOLUS
1000.0000 mL | Freq: Once | INTRAVENOUS | Status: AC
  Administered 2018-04-09: 1000 mL via INTRAVENOUS

## 2018-04-09 MED ORDER — IOPAMIDOL (ISOVUE-300) INJECTION 61%
30.0000 mL | Freq: Once | INTRAVENOUS | Status: AC | PRN
  Administered 2018-04-09: 30 mL via ORAL

## 2018-04-09 MED ORDER — HYDROMORPHONE HCL 1 MG/ML IJ SOLN
1.0000 mg | Freq: Once | INTRAMUSCULAR | Status: AC
  Administered 2018-04-09: 1 mg via INTRAVENOUS

## 2018-04-09 NOTE — Discharge Instructions (Signed)
You are evaluated for abdominal pain, and evaluated by the surgeon after a borderline CT scan of the appendix, and felt most likely at this point due to constipation.  Return to the emergency department immediately for any worsening or uncontrolled abdominal pain, any fever, or any other symptoms concerning to you.  You are being started on antibiotic for bacterial vaginosis.

## 2018-04-09 NOTE — ED Triage Notes (Signed)
Pt arrived via POV with reports of RLQ abd pain that started during the night, pt states the pain is radiating to the right side.  Pt in obvious discomfort in lobby, pt still has appendix.   Pt reports nausea and vomiting.

## 2018-04-09 NOTE — Consult Note (Addendum)
SURGICAL CONSULTATION NOTE (initial) - cpt: [phone]  Patient seen and examined as described below with surgical PA-C, Ardell Isaacs.  Assessment/Plan: (ICD-10's: K59.0) In summary, patient is a 27 y.o. female with severe constipation involving nearly her entire colon from ileocecal junction through proximal-/mid- sigmoid colon without appendiceal thickening, peri-appendiceal inflammation, or leukocytosis despite appendiceal diameter at upper limit of normal (patient's being 7 mm and normal typically being 5 - 6 mm), complicated by pertinent comorbidities including obesity (BMI 33), IBD controlled with Bentyl until self-discontinued by patient a few months ago, celiac disease, HTN, hypothyroidism, and major depression disorder.   - reviewed CT images and interpretation with patient and her wife   - importance of hydration and high-fiber heart-healthy diet discussed + Colace stool softener to reduce risk of constipation  - Miralax or magnesium citrate as needed for relief of constipation, alternatively milk of magnesia that patient's wife states she uses at home for skin care    - outpatient primary care follow-up for constipation, celiac disease/IBD, and hypothyroidism  - signs/symptoms of appendicitis discussed, okay with discharge home with return precautions  - weight loss and smoking cessation encouraged  I have personally reviewed the patient's chart, evaluated/examined the patient, proposed the recommended management, and discussed these recommendations with the patient and her wife to their expressed satisfaction as well as with patient's ED physician.  Thank you for the opportunity to participate in this patient's care.  -- Marilynne Drivers Rosana Hoes, MD, [place]: [facility] - Partnering for exceptional care. Office: [phone]    SURGICAL CONSULTATION NOTE (initial) - cpt: [phone]   HISTORY OF PRESENT ILLNESS (HPI):  27 y.o. female presented to [facility]  ED today for evaluation of abdominal pain. Patient reports she has had abdominal pain for the last 12 hours which started periumbilically and has since radiated to there right abdomen. The pain was waxing and waning at first but has since become constant. The pain medications in the ED have helped. No associated fevers, chills, nausea, or emesis. She notes her last BM was yesterday. She does endorse a history of constipation in the past given her history of celiac disease. She took Bentyl for this in the past but stopped taking it a few months ago because she felt she was fairly regular with BMs. In the ED, she did not have a WBC. CT showed showed an appendix of 58m without wall thickening nor fat stranding.   Surgery is consulted by ED physician Dr. RLisa Roca MD in this context for evaluation and management of abdominal pain and r/o appendicitis.  PAST MEDICAL HISTORY (PMH):  Past Medical History:  Diagnosis Date  . Abnormal Pap smear of cervix 10/29/2017   HSIL  . Celiac disease   . Depression   . Hypertension   . Hypothyroid   [place]    had febrile seizures as child, none since and on meds for this.     PAST SURGICAL HISTORY (PTitus:  Past Surgical History:  Procedure Laterality Date  . CERVICAL CONIZATION W/BX N/A 01/12/2018   Procedure: COLD KNIFE CONIZATION OF CERVIX;  Surgeon: FJonnie Kind MD;  Location: [facility];  Service: Gynecology;  Laterality: N/A;  . REPAIR EXTENSOR TENDON HAND Right   . TONSILLECTOMY       MEDICATIONS:  Prior to Admission medications   Medication Sig Start Date End Date Taking? Authorizing Provider  acetaminophen (TYLENOL) 325 MG tablet Take 650 mg by mouth every 6 (six) hours as  needed for moderate pain or headache.   Yes [provider]  Cholecalciferol (VITAMIN D3) 2000 units TABS Take 2,000 Units by mouth daily.   Yes [provider]  hydrOXYzine (ATARAX/VISTARIL) 10 MG tablet Take 10-20 mg by mouth every 8 (eight) hours  as needed for anxiety. 02/01/18  Yes [provider]  ibuprofen (ADVIL,MOTRIN) 200 MG tablet Take 400 mg by mouth every 6 (six) hours as needed for headache or moderate pain.   Yes [provider]  losartan-hydrochlorothiazide (HYZAAR) 100-25 MG tablet Take 0.5 tablets by mouth daily. 03/10/18  Yes [provider]  albuterol (PROVENTIL HFA;VENTOLIN HFA) 108 (90 Base) MCG/ACT inhaler Inhale 2 puffs into the lungs every 6 (six) hours as needed for wheezing or shortness of breath. 10/24/17   Laban Emperor, PA-C  metroNIDAZOLE (METROGEL) 0.75 % vaginal gel Place 1 Applicatorful vaginally at bedtime. Apply one applicatorful to vagina at bedtime 3 times weekly during postop care. Begin in 3 days. Patient not taking: Reported on 01/20/2018 01/12/18   Jonnie Kind, MD     ALLERGIES:  Allergies  Allergen Reactions  . Gluten Meal Other (See Comments)    Unknown  . Peanut-Containing Drug Products Other (See Comments)    Pecans     SOCIAL HISTORY:  Social History   Socioeconomic History  . Marital status: Married    Spouse name: Not on file  . Number of children: Not on file  . Years of education: Not on file  . Highest education level: Not on file  Occupational History  . Not on file  Social Needs  . Financial resource strain: Not on file  . Food insecurity:    Worry: Not on file    Inability: Not on file  . Transportation needs:    Medical: Not on file    Non-medical: Not on file  Tobacco Use  . Smoking status: Current Every Day Smoker    Packs/day: 0.50    Years: 10.00    Pack years: 5.00    Types: Cigarettes  . Smokeless tobacco: Never Used  Substance and Sexual Activity  . Alcohol use: Yes    Frequency: Never    Comment: occassionally  . Drug use: Not Currently    Types: Marijuana    Comment: last time used was 08/21/2017.  Marland Kitchen Sexual activity: Not Currently    Birth control/protection: None  Lifestyle  . Physical activity:    Days per week: Not on  file    Minutes per session: Not on file  . Stress: Not on file  Relationships  . Social connections:    Talks on phone: Not on file    Gets together: Not on file    Attends religious service: Not on file    Active member of club or organization: Not on file    Attends meetings of clubs or organizations: Not on file    Relationship status: Not on file  . Intimate partner violence:    Fear of current or ex partner: Not on file    Emotionally abused: Not on file    Physically abused: Not on file    Forced sexual activity: Not on file  Other Topics Concern  . Not on file  Social History Narrative  . Not on file    The patient currently resides (home / rehab facility / nursing home): Home The patient normally is (ambulatory / bedbound): Ambulatory   FAMILY HISTORY:  Family History  Problem Relation Age of Onset  .  Hypertension Father   . Diabetes Mother   . Hypertension Mother      REVIEW OF SYSTEMS:  Constitutional: denies weight loss, fever, chills, or sweats  Eyes: denies any other vision changes, history of eye injury  ENT: denies sore throat, hearing problems  Respiratory: denies shortness of breath, wheezing  Cardiovascular: denies chest pain, palpitations  Gastrointestinal: + abdominal pain, denied N/V, or diarrhea/and bowel function as per HPI Genitourinary: denies burning with urination or urinary frequency Musculoskeletal: denies any other joint pains or cramps  Skin: denies any other rashes or skin discolorations  Neurological: denies any other headache, dizziness, weakness  Psychiatric: denies any other depression, anxiety   All other review of systems were negative   VITAL SIGNS:  Temp:  [97.5 F (36.4 C)-97.8 F (36.6 C)] 97.8 F (36.6 C) (10/04 1216) Pulse Rate:  [40-68] 40 (10/04 1230) Resp:  [18] 18 (10/04 0736) BP: (117-153)/(63-93) 122/70 (10/04 1230) SpO2:  [96 %-100 %] 96 % (10/04 1230) Weight:  [86.2 kg] 86.2 kg (10/04 0736)     Height: 5' 4"   (162.6 cm) Weight: 86.2 kg BMI (Calculated): 32.6   INTAKE/OUTPUT:  This shift: Total I/O In: 1051.8 [IV Piggyback:1051.8] Out: -   Last 2 shifts: @IOLAST2SHIFTS @   PHYSICAL EXAM:  Constitutional:  -- Obese body habitus  -- Awake, alert, and oriented x3, no apparent distress Eyes:  -- Pupils equally round and reactive to light  -- No scleral icterus, B/L no occular discharge Ear, nose, throat: -- Neck is FROM WNL Pulmonary:  -- No wheezes or rhales -- Equal breath sounds bilaterally -- Breathing non-labored at rest Cardiovascular:  -- S1, S2 present  -- No pericardial rubs  Gastrointestinal:  -- Abdomen soft, nontender, non-distended, no guarding or rebound tenderness -- No abdominal masses appreciated, pulsatile or otherwise  Musculoskeletal and Integumentary:  -- Wounds or skin discoloration: None appreciated -- Extremities: B/L UE and LE FROM, hands and feet warm, no edema  Neurologic:  -- Motor function: Intact and symmetric -- Sensation: Intact and symmetric Psychiatric:  -- Mood and affect WNL    Labs:  CBC Latest Ref Rng & Units 04/09/2018 01/06/2018 07/19/2017  WBC 3.6 - 11.0 K/uL 6.1 6.4 9.3  Hemoglobin 12.0 - 16.0 g/dL 13.9 12.7 13.5  Hematocrit 35.0 - 47.0 % 39.1 38.7 39.6  Platelets 150 - 440 K/uL 240 264 232   CMP Latest Ref Rng & Units 04/09/2018 01/06/2018 07/19/2017  Glucose 70 - 99 mg/dL 164(H) 59(L) 102(H)  BUN 6 - 20 mg/dL 10 13 7   Creatinine 0.44 - 1.00 mg/dL 0.76 0.56 0.69  Sodium 135 - 145 mmol/L 139 136 138  Potassium 3.5 - 5.1 mmol/L 3.8 3.6 3.8  Chloride 98 - 111 mmol/L 107 104 107  CO2 22 - 32 mmol/L 21(L) 24 24  Calcium 8.9 - 10.3 mg/dL 8.7(L) 8.5(L) 8.6(L)  Total Protein 6.5 - 8.1 g/dL 7.4 7.0 -  Total Bilirubin 0.3 - 1.2 mg/dL 0.6 0.5 -  Alkaline Phos 38 - 126 U/L 62 62 -  AST 15 - 41 U/L 28 15 -  ALT 0 - 44 U/L 15 12 -    Imaging studies:   --CT A/P w/ Pelvis:   IMPRESSION: Cholelithiasis without gross evidence of acute  cholecystitis by CT. Borderline enlarged appendix 7 mm diameter though no definite wall thickening or periappendiceal inflammatory changes are identified to suggest acute appendicitis; recommend clinical correlation.  --US Pelvic Complete:   IMPRESSION: Small amount of free pelvic fluid. Exam  is otherwise unremarkable. No acute abnormality. No evidence of ovarian torsion.   Assessment/Plan: (ICD-10's: K65.04) 27 y.o. female with constipation, complicated by pertinent comorbidities including HTN, Hypothyroidism, Celiac Disease, Seizure Disorder, Tobacco Abuse Disorder (Smoking) and Obesity.   - Reviewed the CT scan with Dr. Rosana Hoes and the patient, it appears that she has a significant stool burden throughout most of her colon consistent with constipation, which is more than likely the source of her discomfort   - Discussed extensively that although the appendix is on the upper limits of normal at 7 mm, there is no leukocytosis, appendiceal wall thickening, or periappendiceal fat stranding to suggest acute appendicitis. She understands that in the future she still may develop appendicitis, but at this time she does not have acute appendicitis. Signs and symptoms to watch out for were given    - Provided education on constipation management at home including milk of magnesia, magnesium citrate, or miralax which she voiced understanding   - No indication for surgery at this time, patient is stable to be discharged, general surgery will sign off.   All of the above findings and recommendations were discussed with the patient and her family, and all of patient's and her family's questions were answered to their expressed satisfaction.  Thank you for the opportunity to participate in this patient's care.   -- Edison Simon, PA-C [facility] 04/09/2018, 1:44 PM [phone] M-F: 7am - 4pm

## 2018-04-09 NOTE — ED Notes (Signed)
Gave a few ice chips to wet mouth per Dr.Lord.

## 2018-04-09 NOTE — ED Provider Notes (Addendum)
[facility] Emergency Department Provider Note ____________________________________________   I have reviewed the triage vital signs and the triage nursing note.  HISTORY  Chief Complaint Abdominal Pain   Historian Patient  HPI Renee Norman is a 27 y.o. female with celiac disease, recent cold knife procedure to cervix 1 month ago, presents with several hours of abdominal pain.  Started umbilical area, now rlq.  No abnormal vaginal bleeding, vaginal discharge.  Some nausea and mild emesis.  No constipation or diarrhea.  Pain is moderate -severe 8/10.  Nothing makes it worse or better.     Past Medical History:  Diagnosis Date  . Abnormal Pap smear of cervix 10/29/2017   HSIL  . Celiac disease   . Depression   . Hypertension   . Hypothyroid   . Seizures ([place])    had febrile seizures as child, none since and on meds for this.    Patient Active Problem List   Diagnosis Date Noted  . CIN III (cervical intraepithelial neoplasia grade III) with severe dysplasia 01/12/2018    Past Surgical History:  Procedure Laterality Date  . CERVICAL CONIZATION W/BX N/A 01/12/2018   Procedure: COLD KNIFE CONIZATION OF CERVIX;  Surgeon: Jonnie Kind, MD;  Location: [facility];  Service: Gynecology;  Laterality: N/A;  . REPAIR EXTENSOR TENDON HAND Right   . TONSILLECTOMY      Prior to Admission medications   Medication Sig Start Date End Date Taking? Authorizing Provider  acetaminophen (TYLENOL) 325 MG tablet Take 650 mg by mouth every 6 (six) hours as needed for moderate pain or headache.   Yes [provider]  Cholecalciferol (VITAMIN D3) 2000 units TABS Take 2,000 Units by mouth daily.   Yes [provider]  hydrOXYzine (ATARAX/VISTARIL) 10 MG tablet Take 10-20 mg by mouth every 8 (eight) hours as needed for anxiety. 02/01/18  Yes [provider]  ibuprofen (ADVIL,MOTRIN) 200 MG tablet Take 400 mg by mouth every 6 (six) hours as  needed for headache or moderate pain.   Yes [provider]  losartan-hydrochlorothiazide (HYZAAR) 100-25 MG tablet Take 0.5 tablets by mouth daily. 03/10/18  Yes [provider]  albuterol (PROVENTIL HFA;VENTOLIN HFA) 108 (90 Base) MCG/ACT inhaler Inhale 2 puffs into the lungs every 6 (six) hours as needed for wheezing or shortness of breath. 10/24/17   Laban Emperor, PA-C  metroNIDAZOLE (FLAGYL) 500 MG tablet Take 1 tablet (500 mg total) by mouth 2 (two) times daily. 04/09/18   Lisa Roca, MD  metroNIDAZOLE (METROGEL) 0.75 % vaginal gel Place 1 Applicatorful vaginally at bedtime. Apply one applicatorful to vagina at bedtime 3 times weekly during postop care. Begin in 3 days. Patient not taking: Reported on 01/20/2018 01/12/18   Jonnie Kind, MD    Allergies  Allergen Reactions  . Gluten Meal Other (See Comments)    Unknown  . Peanut-Containing Drug Products Other (See Comments)    Pecans    Family History  Problem Relation Age of Onset  . Hypertension Father   . Diabetes Mother   . Hypertension Mother     Social History Social History   Tobacco Use  . Smoking status: Current Every Day Smoker    Packs/day: 0.50    Years: 10.00    Pack years: 5.00    Types: Cigarettes  . Smokeless tobacco: Never Used  Substance Use Topics  . Alcohol use: Yes    Frequency: Never    Comment: occassionally  . Drug use: Not  Currently    Types: Marijuana    Comment: last time used was 08/21/2017.    Review of Systems  Constitutional: Negative for fever. Eyes: Negative for visual changes. ENT: Negative for sore throat. Cardiovascular: Negative for chest pain. Respiratory: Negative for shortness of breath. Gastrointestinal: Positive as per HPI. Genitourinary: Negative for dysuria. Musculoskeletal: Negative for back pain. Skin: Negative for rash. Neurological: Negative for headache.  ____________________________________________   PHYSICAL EXAM:  VITAL SIGNS: ED  Triage Vitals  Enc Vitals Group     BP 04/09/18 0736 (!) 153/93     Pulse Rate 04/09/18 0736 68     Resp 04/09/18 0736 18     Temp 04/09/18 0736 (!) 97.5 F (36.4 C)     Temp Source 04/09/18 0736 Oral     SpO2 04/09/18 0736 100 %     Weight 04/09/18 0736 190 lb (86.2 kg)     Height 04/09/18 0736 5' 4"  (1.626 m)     Head Circumference --      Peak Flow --      Pain Score 04/09/18 0735 8     Pain Loc --      Pain Edu? --      Excl. in [place]? --      Constitutional: Alert and oriented.  HEENT      Head: Normocephalic and atraumatic.      Eyes: Conjunctivae are normal. Pupils equal and round.       Ears:         Nose: No congestion/rhinnorhea.      Mouth/Throat: Mucous membranes are moist.      Neck: No stridor. Cardiovascular/Chest: Normal rate, regular rhythm.  No murmurs, rubs, or gallops. Respiratory: Normal respiratory effort without tachypnea nor retractions. Breath sounds are clear and equal bilaterally. No wheezes/rales/rhonchi. Gastrointestinal: Soft. No distention, no rebound.  Right lower quadrant tenderness with with some guarding. Genitourinary/rectal: No vaginal discharge.  No cervicitis.  Right adnexal discomfort without mass. Musculoskeletal: Nontender with normal range of motion in all extremities. No joint effusions.  No lower extremity tenderness.  No edema. Neurologic:  Normal speech and language. No gross or focal neurologic deficits are appreciated. Skin:  Skin is warm, dry and intact. No rash noted. Psychiatric: Mood and affect are normal. Speech and behavior are normal. Patient exhibits appropriate insight and judgment.   ____________________________________________  LABS (pertinent positives/negatives) I, Lisa Roca, MD the attending physician have reviewed the labs noted below.  Labs Reviewed  WET PREP, GENITAL - Abnormal; Notable for the following components:      Result Value   Clue Cells Wet Prep HPF POC PRESENT (*)    WBC, Wet Prep HPF POC MANY  (*)    All other components within normal limits  COMPREHENSIVE METABOLIC PANEL - Abnormal; Notable for the following components:   CO2 21 (*)    Glucose, Bld 164 (*)    Calcium 8.7 (*)    All other components within normal limits  URINALYSIS, COMPLETE (UACMP) WITH MICROSCOPIC - Abnormal; Notable for the following components:   Color, Urine YELLOW (*)    APPearance HAZY (*)    Hgb urine dipstick MODERATE (*)    Leukocytes, UA SMALL (*)    All other components within normal limits  CHLAMYDIA/NGC RT PCR ([facility] ONLY)  LIPASE, BLOOD  CBC  POC URINE PREG, ED  POCT PREGNANCY, URINE    ____________________________________________    EKG I, Lisa Roca, MD, the attending physician have personally viewed and interpreted all  ECGs.  None ____________________________________________  RADIOLOGY   CT abdomen pelvis with contrast, radiologist report reviewed:  IMPRESSION: Cholelithiasis without gross evidence of acute cholecystitis by CT.  Borderline enlarged appendix 7 mm diameter though no definite wall thickening or periappendiceal inflammatory changes are identified to suggest acute appendicitis; recommend clinical correlation.  If patient has persistent or worsening symptoms, consider follow-up/repeat imaging.  Ultrasound pelvic, radiologist report reviewed:  IMPRESSION: Small amount of free pelvic fluid. Exam is otherwise unremarkable. No acute abnormality. No evidence of ovarian torsion. __________________________________________  PROCEDURES  Procedure(s) performed: None  Procedures  Critical Care performed: None   ____________________________________________  ED COURSE / ASSESSMENT AND PLAN  Pertinent labs & imaging results that were available during my care of the patient were reviewed by me and considered in my medical decision making (see chart for details).    Symptoms are clinically concerning for acute appendicitis.  We discussed overall  reassuring laboratory studies and risk and benefit of CT scan and chose to proceed with imaging to rule out appendicitis.   CT scan showing appendix at the upper limit of normal.  Completed pelvic exam, right adnexal discomfort on exam, but otherwise no mass or cervicitis.  Pelvic ultrasound reassuring.  Discussed with surgery regarding clinical picture consistent with appendicitis.  I discussed with Dr. Rosana Hoes, he feels like clinically not consistent with appendicitis, more consistent with constipation at this point.  Patient is comfortable going home and understands return precautions.  I discussed sending her with treatment for pectoral vaginosis.  No clinical concern for PID.  Gonorrhea and chlamydia are pending at time of disc charge  CONSULTATIONS: Surgery, Dr. Rosana Hoes.   Patient / Family / Caregiver informed of clinical course, medical decision-making process, and agree with plan.   ___________________________________________   FINAL CLINICAL IMPRESSION(S) / ED DIAGNOSES   Final diagnoses:  RLQ abdominal pain  Bacterial vaginosis  Constipation, unspecified constipation type      ___________________________________________         Note: This dictation was prepared with Dragon dictation. Any transcriptional errors that result from this process are unintentional    Lisa Roca, MD 04/09/18 1224    Lisa Roca, MD 04/09/18 1406

## 2018-04-11 DIAGNOSIS — K5900 Constipation, unspecified: Secondary | ICD-10-CM | POA: Insufficient documentation

## 2018-09-05 ENCOUNTER — Emergency Department
Admission: EM | Admit: 2018-09-05 | Discharge: 2018-10-06 | Disposition: E | Payer: Self-pay | Attending: Emergency Medicine | Admitting: Emergency Medicine

## 2018-09-05 DIAGNOSIS — I10 Essential (primary) hypertension: Secondary | ICD-10-CM | POA: Insufficient documentation

## 2018-09-05 DIAGNOSIS — E039 Hypothyroidism, unspecified: Secondary | ICD-10-CM | POA: Insufficient documentation

## 2018-09-05 DIAGNOSIS — I469 Cardiac arrest, cause unspecified: Secondary | ICD-10-CM | POA: Insufficient documentation

## 2018-09-05 DIAGNOSIS — F17210 Nicotine dependence, cigarettes, uncomplicated: Secondary | ICD-10-CM | POA: Insufficient documentation

## 2018-09-05 MED ORDER — EPINEPHRINE PF 1 MG/10ML IJ SOSY
PREFILLED_SYRINGE | INTRAMUSCULAR | Status: AC | PRN
  Administered 2018-09-05 (×5): 1 mg via INTRAVENOUS

## 2018-09-05 MED ORDER — SODIUM BICARBONATE 8.4 % IV SOLN
INTRAVENOUS | Status: AC | PRN
  Administered 2018-09-05: 50 meq via INTRAVENOUS

## 2018-09-05 MED ORDER — CALCIUM CHLORIDE 10 % IV SOLN
INTRAVENOUS | Status: AC
  Filled 2018-09-05: qty 10

## 2018-09-05 MED ORDER — SODIUM CHLORIDE 0.9 % IV SOLN
INTRAVENOUS | Status: AC | PRN
  Administered 2018-09-05: 1000 mL via INTRAVENOUS

## 2018-09-05 MED ORDER — CALCIUM GLUCONATE 10 % IV SOLN
INTRAVENOUS | Status: AC
  Filled 2018-09-05: qty 10

## 2018-09-05 MED ORDER — CALCIUM CHLORIDE 10 % IV SOLN
INTRAVENOUS | Status: AC | PRN
  Administered 2018-09-05 (×2): 1 g via INTRAVENOUS

## 2018-10-06 NOTE — Progress Notes (Signed)
Chaplain received a call regarding the patient's arrival via EMS at 4:30 am with CPR in progress. Chaplain attended to the family and offered compassionate presence as they awaited news of their loved one's status. The family (mother, sister, and step-father) were compassionate and comforting to one another, but were in a state of disbelief and anticipatory grief. After receiving the update that the patient succumbed to her injuries, the chaplain maintained compassionate presence including prayer for their strength in the days and weeks to come. The chaplain offered a prayer shawl and prayed for the patient in the hopes of offering some comfort in their inability to touch or kiss her (Medical Examiner Case).   The patient's wife and her family later arrived and the chaplain accompanied them to see and spend time with her. Chaplain again, offered compassionate presence, prayer and a listening ear in their time of great need. A prayer shawl was also offered laid upon the patient with prayer and gifted to the patient's wife as a symbol of God's eternal, enveloping love for the patient and for them.

## 2018-10-06 NOTE — ED Notes (Signed)
Pt transported to morgue

## 2018-10-06 NOTE — ED Provider Notes (Signed)
[facility] Emergency Department Provider Note  ________________________________________   I have reviewed the triage vital signs and the nursing notes.   HISTORY  Chief Complaint Cardiac Arrest   History limited by: Unresponsive   HPI Renee Norman is a 28 y.o. female who presents to the emergency department today via EMS after being found hanging outside of her residence. Per EMS the last time anyone had heard from her was around 2:45 PM. EMS stated that there was a note found that stated what she was going to do. Apparently she was found later this evening hanging outside of her residence. When EMS arrived she was asystolic. They did have one shockable rhythm during CPR but never recovered pulses. After roughly 45 minutes of CPR in the field they transported the patient to the emergency department.    Per medical record review patient has a history of depression  Past Medical History:  Diagnosis Date  . Abnormal Pap smear of cervix 10/29/2017   HSIL  . Celiac disease   . Depression   . Hypertension   . Hypothyroid   . Seizures ([place])    had febrile seizures as child, none since and on meds for this.    Patient Active Problem List   Diagnosis Date Noted  . Constipation   . CIN III (cervical intraepithelial neoplasia grade III) with severe dysplasia 01/12/2018    Past Surgical History:  Procedure Laterality Date  . CERVICAL CONIZATION W/BX N/A 01/12/2018   Procedure: COLD KNIFE CONIZATION OF CERVIX;  Surgeon: Jonnie Kind, MD;  Location: [facility];  Service: Gynecology;  Laterality: N/A;  . REPAIR EXTENSOR TENDON HAND Right   . TONSILLECTOMY      Prior to Admission medications   Medication Sig Start Date End Date Taking? Authorizing Provider  acetaminophen (TYLENOL) 325 MG tablet Take 650 mg by mouth every 6 (six) hours as needed for moderate pain or headache.    [provider]  albuterol (PROVENTIL HFA;VENTOLIN HFA) 108 (90 Base)  MCG/ACT inhaler Inhale 2 puffs into the lungs every 6 (six) hours as needed for wheezing or shortness of breath. 10/24/17   Laban Emperor, PA-C  Cholecalciferol (VITAMIN D3) 2000 units TABS Take 2,000 Units by mouth daily.    [provider]  HYDROcodone-acetaminophen (NORCO/VICODIN) 5-325 MG tablet Take 1 tablet by mouth every 6 (six) hours as needed for moderate pain. 04/09/18   Lisa Roca, MD  hydrOXYzine (ATARAX/VISTARIL) 10 MG tablet Take 10-20 mg by mouth every 8 (eight) hours as needed for anxiety. 02/01/18   [provider]  ibuprofen (ADVIL,MOTRIN) 200 MG tablet Take 400 mg by mouth every 6 (six) hours as needed for headache or moderate pain.    [provider]  losartan-hydrochlorothiazide (HYZAAR) 100-25 MG tablet Take 0.5 tablets by mouth daily. 03/10/18   [provider]  metroNIDAZOLE (FLAGYL) 500 MG tablet Take 1 tablet (500 mg total) by mouth 2 (two) times daily. 04/09/18   Lisa Roca, MD  metroNIDAZOLE (METROGEL) 0.75 % vaginal gel Place 1 Applicatorful vaginally at bedtime. Apply one applicatorful to vagina at bedtime 3 times weekly during postop care. Begin in 3 days. Patient not taking: Reported on 01/20/2018 01/12/18   Jonnie Kind, MD    Allergies Gluten meal and Peanut-containing drug products  Family History  Problem Relation Age of Onset  . Hypertension Father   . Diabetes Mother   . Hypertension Mother     Social History Social History   Tobacco  Use  . Smoking status: Current Every Day Smoker    Packs/day: 0.50    Years: 10.00    Pack years: 5.00    Types: Cigarettes  . Smokeless tobacco: Never Used  Substance Use Topics  . Alcohol use: Yes    Frequency: Never    Comment: occassionally  . Drug use: Not Currently    Types: Marijuana    Comment: last time used was 08/21/2017.    Review of Systems Unable to obtain secondary to unresponsiveness.  ____________________________________________   PHYSICAL  EXAM:  VITAL SIGNS: ED Triage Vitals  Enc Vitals Group     BP --      Pulse Rate Oct 03, 2018 0527 (!) 0     Resp --      Temp --      Temp src --      SpO2 --      Weight 03-Oct-2018 0457 270 lb (122.5 kg)   Constitutional: Unresponsive. King airway in place. [place]ou device operating.  Eyes: Pupils fixed and dilated.  ENT      Head: Normocephalic. Cardiovascular: Pulseless when lucas not activated.  Respiratory: King airway in place. Patient being bagged.  Genitourinary: Deferred Musculoskeletal: No obvious deformity Neurologic:  Unresponsive. Pupils fixed and dilated.  Skin:  Skin is cool. Mottling present.  ____________________________________________    LABS (pertinent positives/negatives)  None  ____________________________________________   EKG  None  ____________________________________________    RADIOLOGY  None  ____________________________________________   PROCEDURES  Procedures  CRITICAL CARE Performed by: Nance Pear   Total critical care time: 35 minutes  Critical care time was exclusive of separately billable procedures and treating other patients.  Critical care was necessary to treat or prevent imminent or life-threatening deterioration.  Critical care was time spent personally by me on the following activities: development of treatment plan with patient and/or surrogate as well as nursing, discussions with consultants, evaluation of patient's response to treatment, examination of patient, obtaining history from patient or surrogate, ordering and performing treatments and interventions, ordering and review of laboratory studies, ordering and review of radiographic studies, pulse oximetry and re-evaluation of patient's condition.  ____________________________________________   INITIAL IMPRESSION / ASSESSMENT AND PLAN / ED COURSE  Pertinent labs & imaging results that were available during my care of the patient were reviewed by me and  considered in my medical decision making (see chart for details).   Patient presented to the emergency department today via EMS as emergency traffic after being found hanged at her residence. The patient was pulseless upon EMS arrival. They did CPR for roughly 45 minutes prior to transport without any return of spontaneous circulation. Upon arrival to the emergency department she continued to be pulseless. Initial rectal temperature was 96. CPR was continued. Temp foley was also placed which showed an initial temperature of 94. Bear hugger was placed. Please see nursing notes for times and medications. Pulse checks in between rounds of CPR continued to show asystole. The patient was pronounced dead after multiple rounds. Informed family of patient's condition.   ____________________________________________   FINAL CLINICAL IMPRESSION(S) / ED DIAGNOSES  Final diagnoses:  Cardiopulmonary arrest [facility])     Note: This dictation was prepared with Dragon dictation. Any transcriptional errors that result from this process are unintentional     Nance Pear, MD 2018/10/03 [phone]

## 2018-10-06 NOTE — ED Notes (Signed)
NS begun at [place] not 0444.

## 2018-10-06 NOTE — Code Documentation (Addendum)
CODE record as follows: 34- arrives in department, lucas in place, compressions via lucas autocompressor. Pt with King airway in place. Per EMS pt found by bystanders outside hanging from dog leash from porch outside. Ems states pt was in asystole on their arrival, into v-fib, shocked once, then into aystole. EMS performed 44mn CPR. Pt with core temp of 94.9 on arrival. EMS gave 3 epi, one amp bicarb,IO to left tibia patent.  See code narrator in chart for medications given. 0456- pulse check, no pulse, asystole on monitor, compressions resumed. 0457- pt placed on bear hugger on high, core temp 94.9. 0503- pulse check, no pulse, asystole on monitor, compressions resumed. 04163 pulse check, no pulse, asystole on monitor, compressions resumed. 0511- core temp 95.8, pulse check, asystole on monitor, no pulse, compressions resumed. 08453 pulse check, no pulse, asystole on monitor, code called, time of death 0514.  2560mof NS infused.

## 2018-10-06 NOTE — ED Triage Notes (Signed)
Pt arrives in cardiac arrest, per ems pt found hanging by dog leash from porch. Pt was last seen around 2 am. Ems gave 3 epi, 1 bicarb, IO to left tibia, ems found in aystole, pt went into v fib, one shock then back to asystole.

## 2018-10-06 DEATH — deceased

## 2019-04-21 IMAGING — CT CT ABD-PELV W/ CM
2 of 4 series · 16 of 46 positions shown, 18 images · IV contrast (APPLIED)
Comparison: 03/09/2017

CLINICAL DATA: RIGHT lower quadrant pain that began during the
night radiating to RIGHT-side, nausea, vomiting, normal white blood
cell count, question appendicitis, history hypertension, celiac
disease

EXAM:
CT ABDOMEN AND PELVIS WITH CONTRAST
TECHNIQUE: Multidetector CT imaging of the abdomen and pelvis was performed
using the standard protocol following bolus administration of
intravenous contrast. Sagittal and coronal MPR images reconstructed
from axial data set.
CONTRAST:  100mL ZF44YF-JPP IOPAMIDOL (ZF44YF-JPP) INJECTION 61% IV.
Dilute oral contrast.

[Series 2: routine abd/pel with · axial · 0.70mm/px · z∈[-523,-98]mm · 13 of 95 slices shown, 15 images]
[im 5/95  soft-tissue]
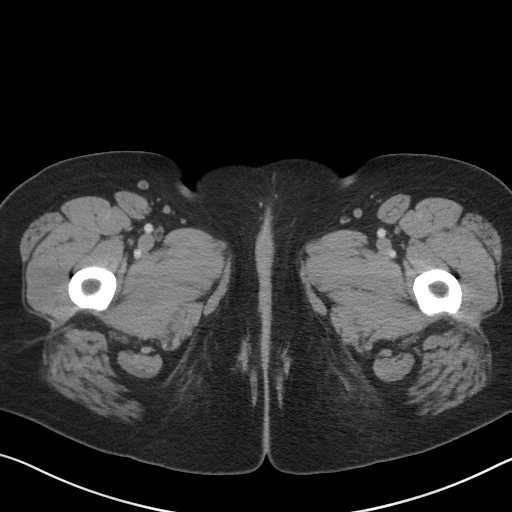
[im 5/95  bone]
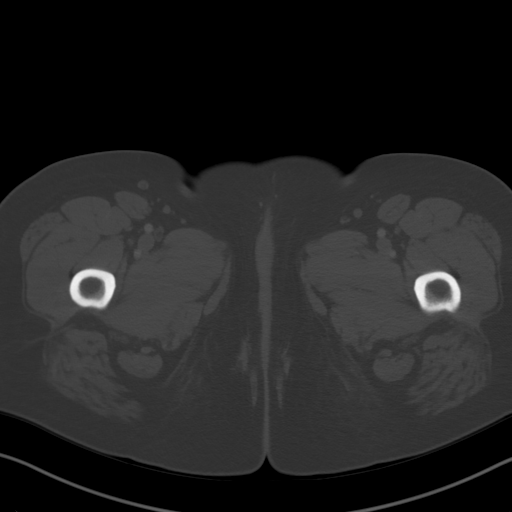
[im 13/95  soft-tissue]
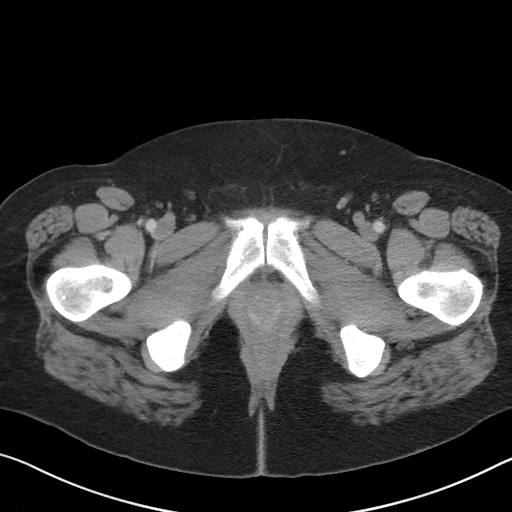
[im 21/95  soft-tissue]
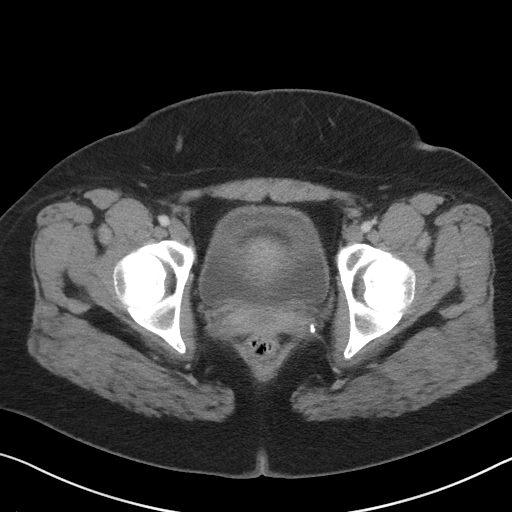
[im 25/95  soft-tissue]
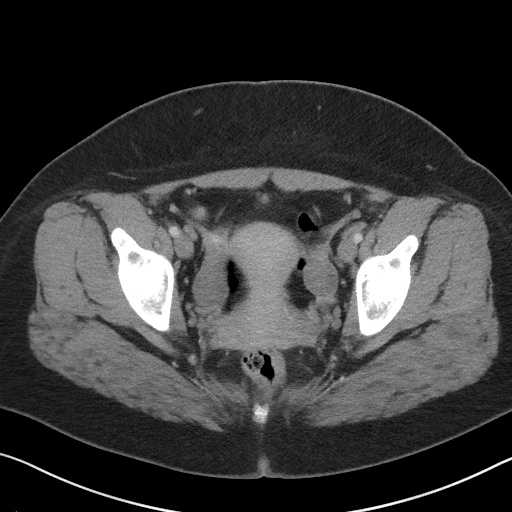
[im 33/95  soft-tissue]
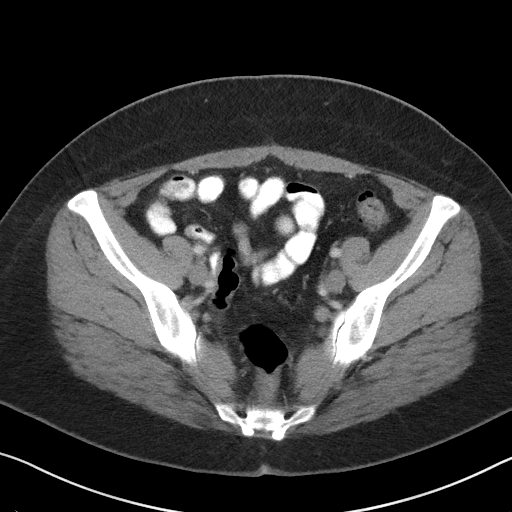
[im 41/95  soft-tissue]
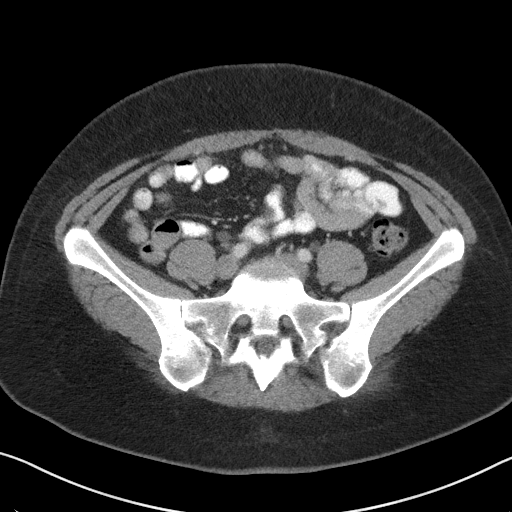
[im 50/95  soft-tissue]
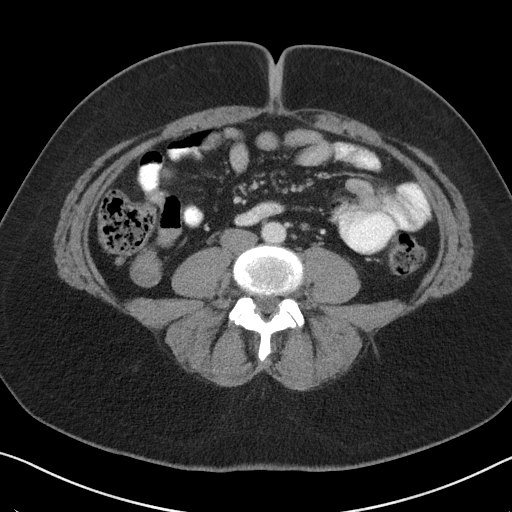
[im 54/95  soft-tissue]
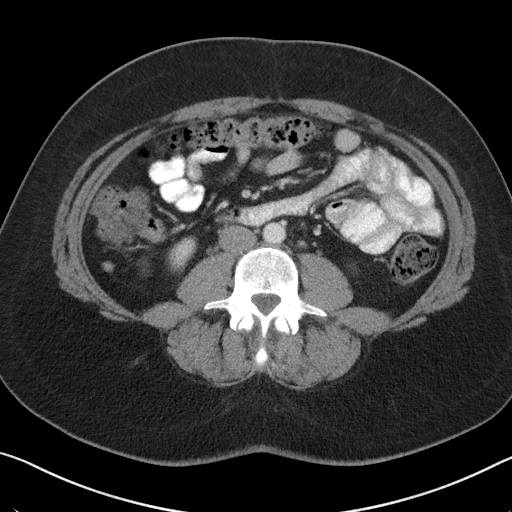
[im 62/95  soft-tissue]
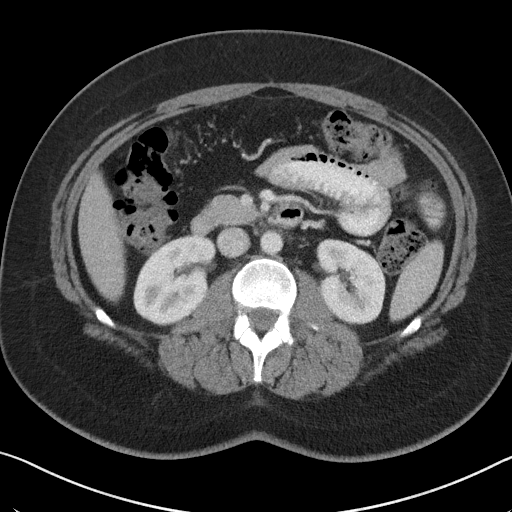
[im 62/95  bone]
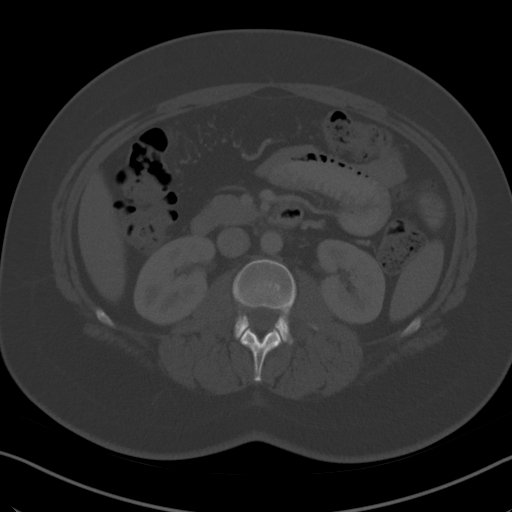
[im 70/95  soft-tissue]
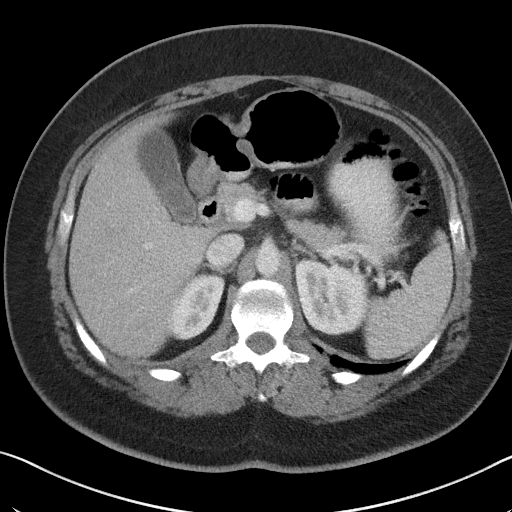
[im 74/95  soft-tissue]
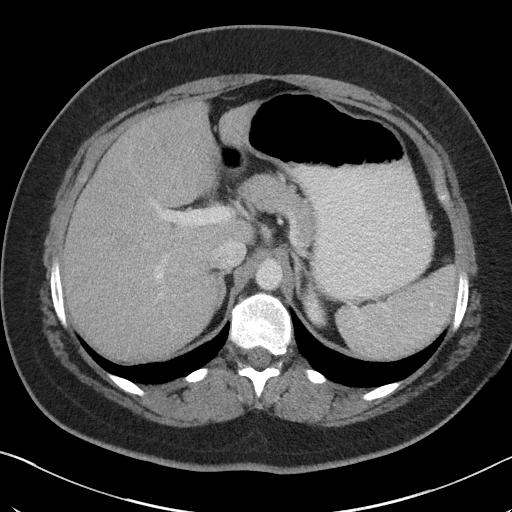
[im 82/95  soft-tissue]
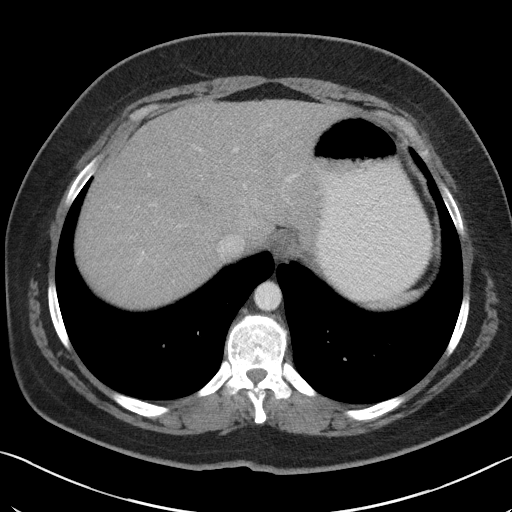
[im 90/95  soft-tissue]
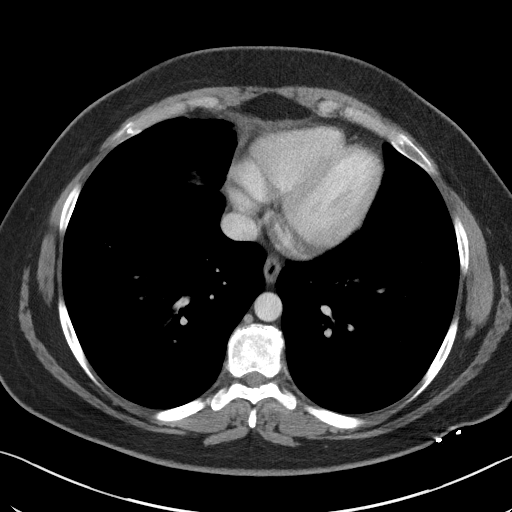

[Series 5: coronal st · coronal · 0.78mm/px · 3 of 93 slices shown]
[im 31/93  soft-tissue]
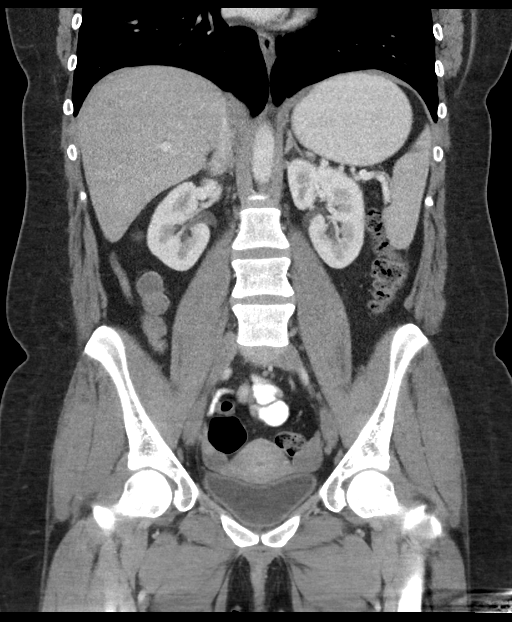
[im 41/93  soft-tissue]
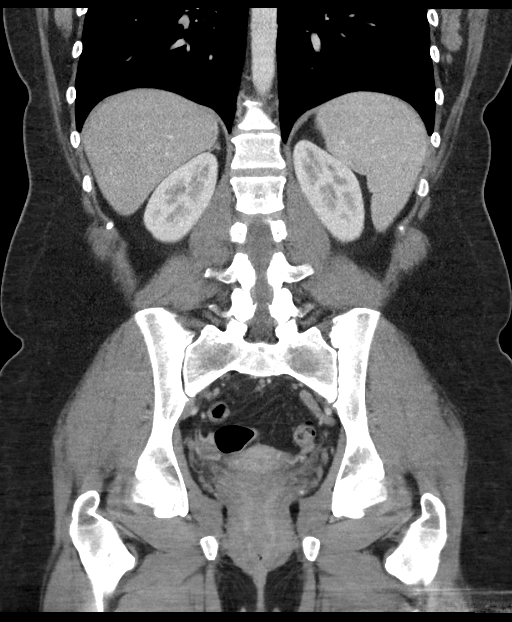
[im 52/93  soft-tissue]
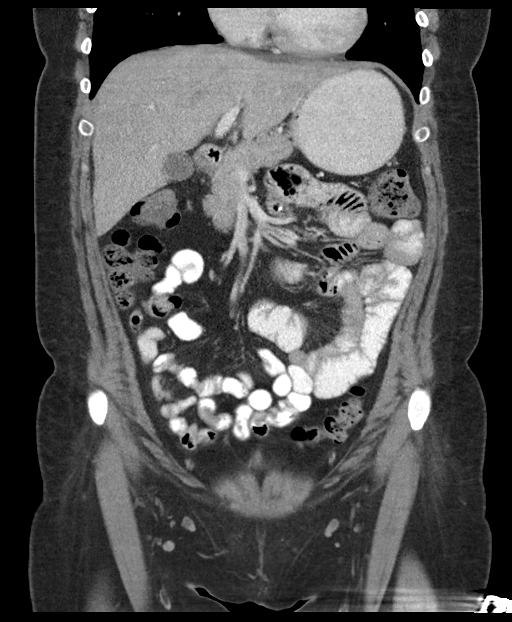

[16 of 46 positions shown; findings below may reference images not displayed]

FINDINGS: Lower chest: Lung bases clear

Hepatobiliary: Liver normal appearance. Faint 21 mm diameter
gallstone in gallbladder better visualized on previous exam. No
gallbladder wall thickening evident. No biliary dilatation.

Pancreas: Normal appearance

Spleen: Normal appearance

Adrenals/Urinary Tract: Adrenal glands, kidneys, ureters, and
bladder normal appearance. No urinary tract calcification

Stomach/Bowel: Appendix is borderline enlarged at 7 mm diameter
though no wall thickening or periappendiceal inflammation are
identified. Stomach and bowel loops otherwise normal appearance..

Vascular/Lymphatic: Few pelvic phleboliths. Vascular structures
patent and normal caliber. No adenopathy.

Reproductive: Unremarkable uterus and ovaries

Other: No free air, free fluid, hernia, or acute inflammatory
process identified.

Musculoskeletal: Unremarkable
IMPRESSION: Cholelithiasis without gross evidence of acute cholecystitis by CT.

Borderline enlarged appendix 7 mm diameter though no definite wall
thickening or periappendiceal inflammatory changes are identified to
suggest acute appendicitis; recommend clinical correlation.

If patient has persistent or worsening symptoms, consider
follow-up/repeat imaging.

## 2020-01-21 IMAGING — US US ART/VEN ABD/PELV/SCROTUM DOPPLER LTD
1 series · 13 of 25 positions shown · non-contrast
Comparison: CT 04/09/2018.

CLINICAL DATA: Right lower quadrant pain.

EXAM:
TRANSABDOMINAL AND TRANSVAGINAL ULTRASOUND OF PELVIS
DOPPLER ULTRASOUND OF OVARIES
TECHNIQUE: Both transabdominal and transvaginal ultrasound examinations of the
pelvis were performed. Transabdominal technique was performed for
global imaging of the pelvis including uterus, ovaries, adnexal
regions, and pelvic cul-de-sac.
It was necessary to proceed with endovaginal exam following the
transabdominal exam to visualize the uterus and ovaries. Color and
duplex Doppler ultrasound was utilized to evaluate blood flow to the
ovaries.

[Series 1: us art/ven abd/pelv/scrotum doppler ltd · 0.23mm/px · 13 of 115 slices shown]
[im 1/115]
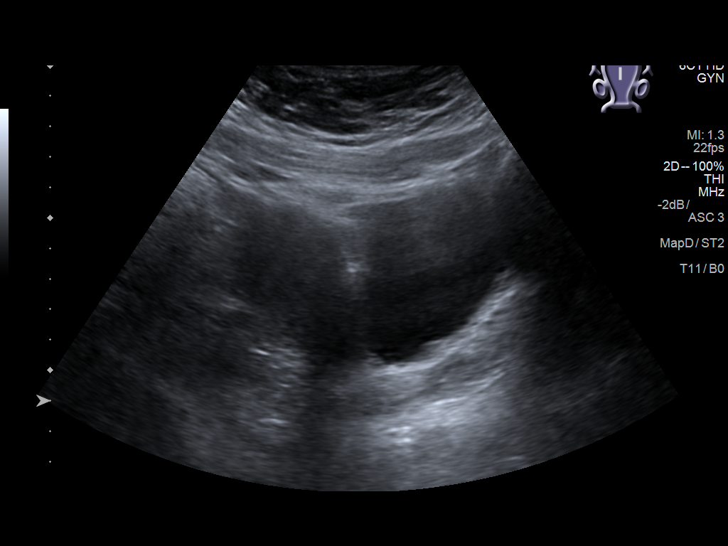
[im 10/115]
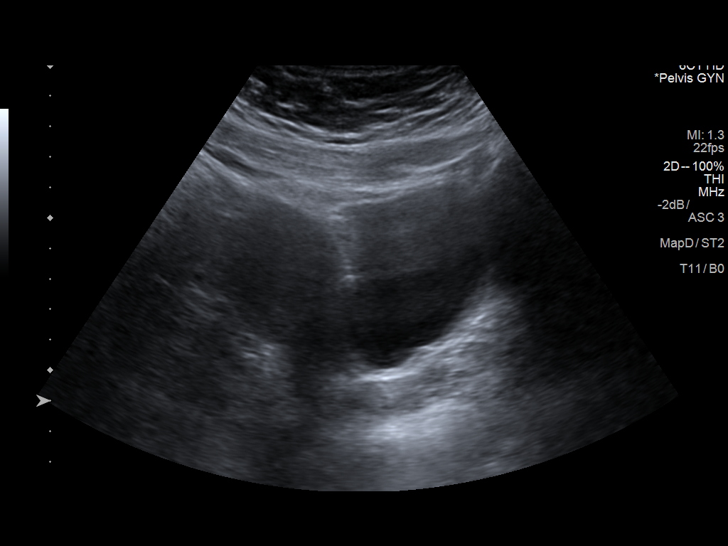
[im 20/115]
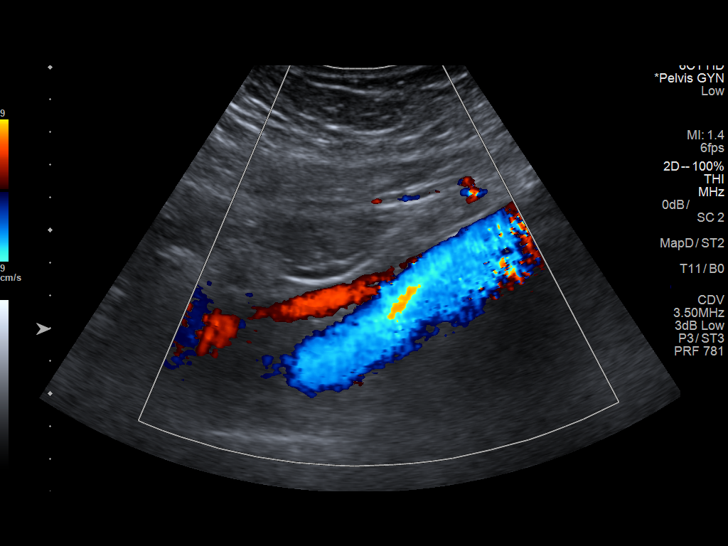
[im 29/115]
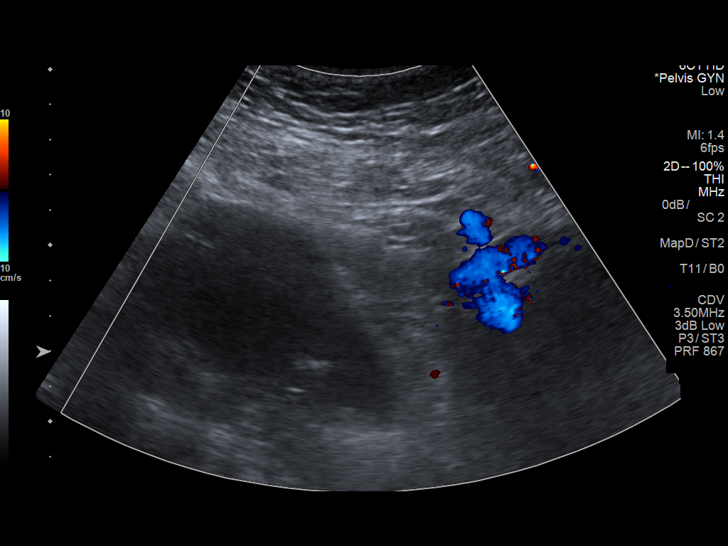
[im 39/115]
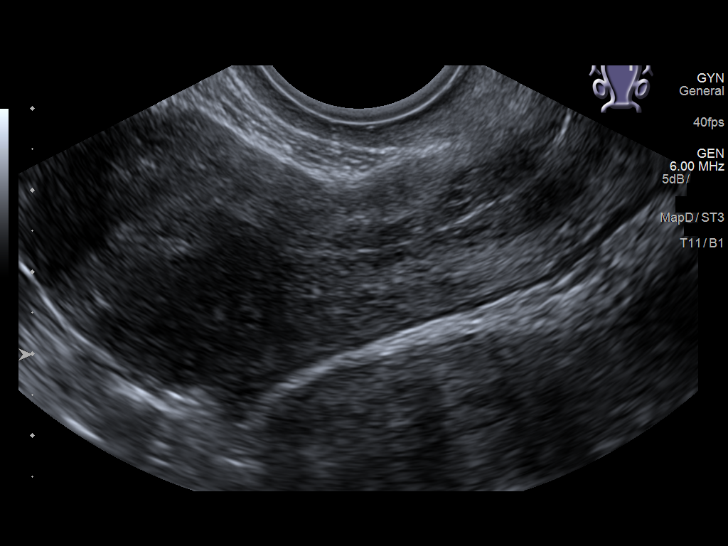
[im 48/115]
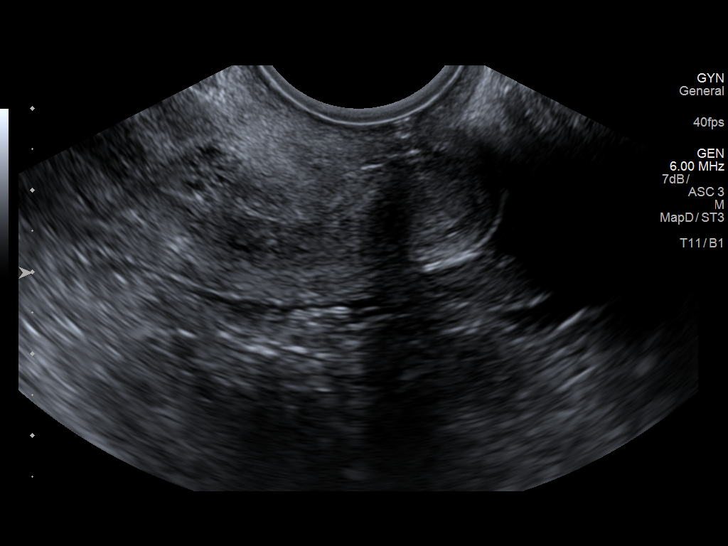
[im 58/115]
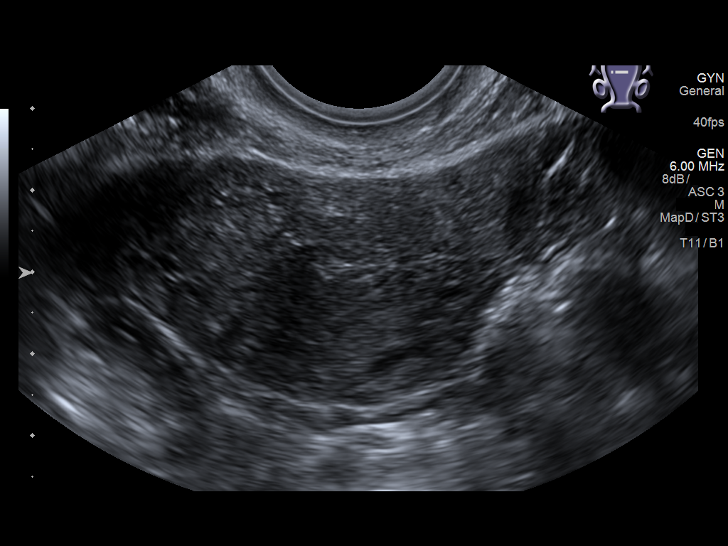
[im 67/115]
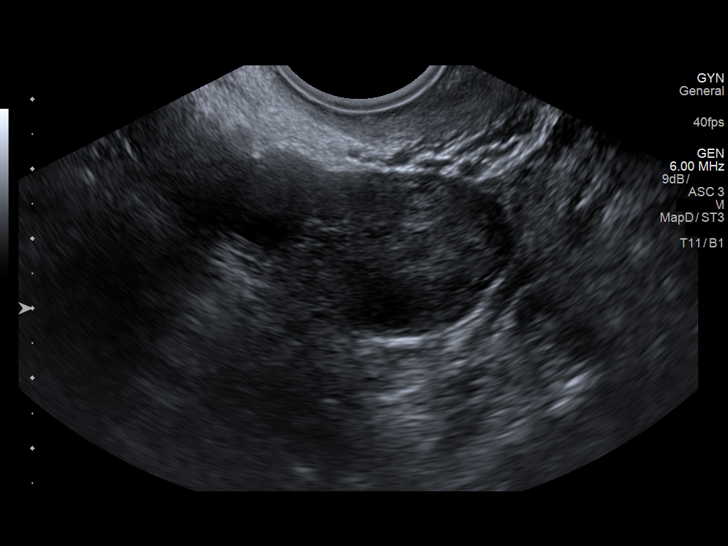
[im 77/115]
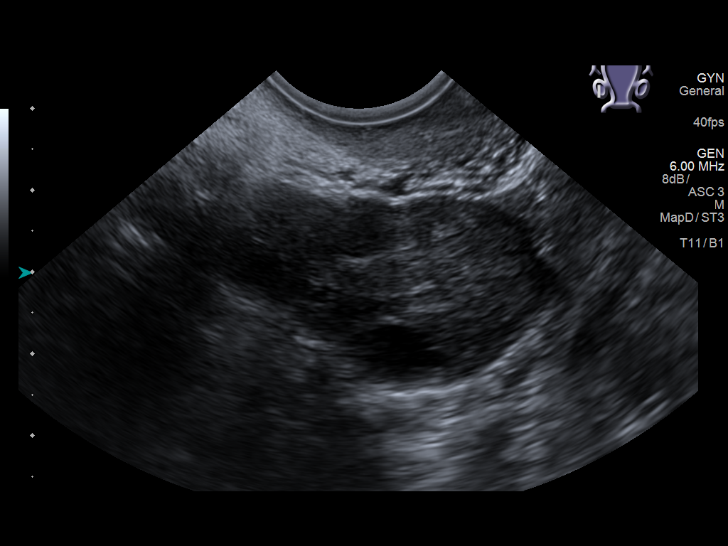
[im 86/115]
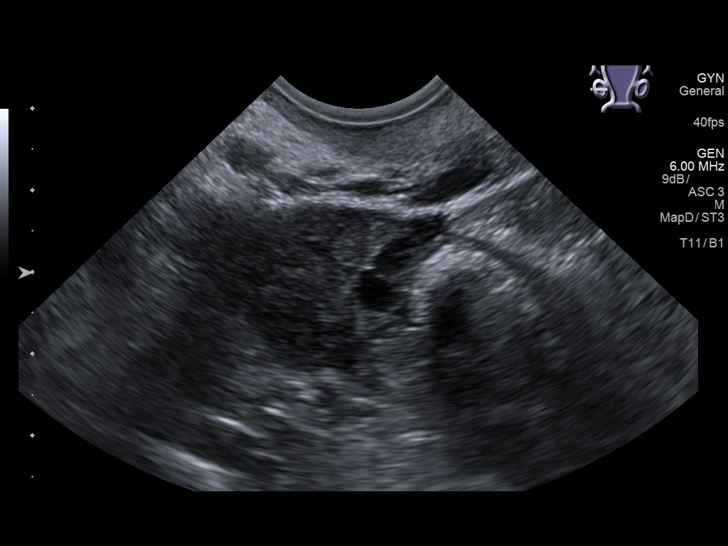
[im 96/115]
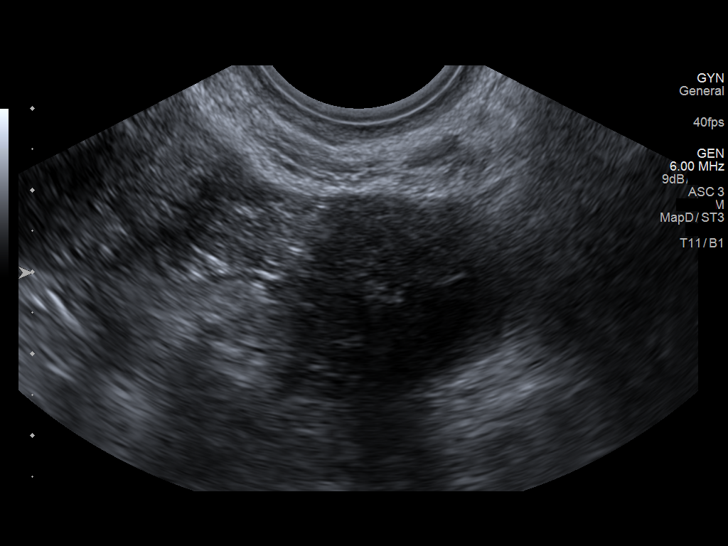
[im 105/115]
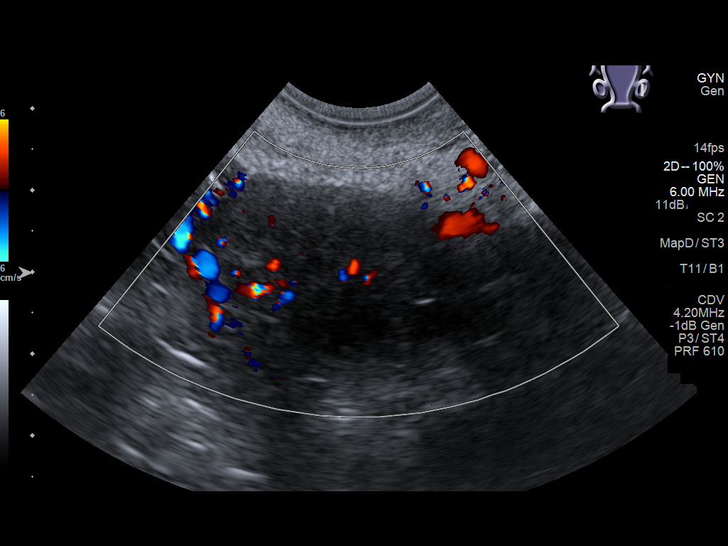
[im 115/115]
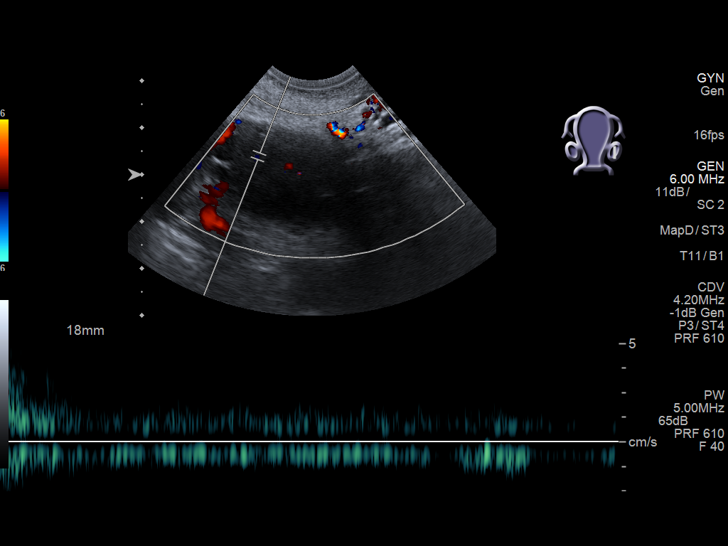

[13 of 25 positions shown; findings below may reference images not displayed]

FINDINGS: Uterus

Measurements: 7.9 x 3.5 x 4.8 cm. No fibroids or other mass
visualized.

Endometrium

Thickness: 5.8 mm.  No focal abnormality visualized.

Right ovary

Measurements: 3.8 x 2.4 x 2.4 cm. Normal appearance/no adnexal mass.

Left ovary

Measurements: 3.1 x 2.2 x 2.4 cm. Normal appearance/no adnexal mass.

Pulsed Doppler evaluation of both ovaries demonstrates normal
low-resistance arterial and venous waveforms.

Other findings

Small amount of free pelvic fluid.
IMPRESSION: Small amount of free pelvic fluid. Exam is otherwise unremarkable.
No acute abnormality. No evidence of ovarian torsion.
